# Patient Record
Sex: Female | Born: 1995 | Marital: Single | Smoking: Former smoker
Health system: Southern US, Community
[De-identification: ages and names within clinical notes are randomized; demographics above are authoritative.]

## PROBLEM LIST (undated history)

## (undated) DIAGNOSIS — F909 Attention-deficit hyperactivity disorder, unspecified type: Secondary | ICD-10-CM

## (undated) DIAGNOSIS — E039 Hypothyroidism, unspecified: Secondary | ICD-10-CM

## (undated) DIAGNOSIS — E041 Nontoxic single thyroid nodule: Secondary | ICD-10-CM

## (undated) DIAGNOSIS — F32A Depression, unspecified: Secondary | ICD-10-CM

## (undated) DIAGNOSIS — F329 Major depressive disorder, single episode, unspecified: Secondary | ICD-10-CM

## (undated) DIAGNOSIS — F419 Anxiety disorder, unspecified: Secondary | ICD-10-CM

## (undated) DIAGNOSIS — F3181 Bipolar II disorder: Secondary | ICD-10-CM

## (undated) HISTORY — DX: Nontoxic single thyroid nodule: E04.1

## (undated) HISTORY — DX: Anxiety disorder, unspecified: F41.9

## (undated) HISTORY — PX: HERNIA REPAIR: SHX51

## (undated) HISTORY — DX: Attention-deficit hyperactivity disorder, unspecified type: F90.9

## (undated) HISTORY — DX: Depression, unspecified: F32.A

## (undated) HISTORY — DX: Bipolar II disorder: F31.81

## (undated) HISTORY — PX: ROOT CANAL: SHX2363

## (undated) HISTORY — PX: ADENOIDECTOMY: SUR15

## (undated) HISTORY — DX: Major depressive disorder, single episode, unspecified: F32.9

## (undated) HISTORY — DX: Hypothyroidism, unspecified: E03.9

---

## 2016-02-22 ENCOUNTER — Encounter: Payer: Self-pay | Admitting: Obstetrics and Gynecology

## 2016-02-22 ENCOUNTER — Ambulatory Visit (INDEPENDENT_AMBULATORY_CARE_PROVIDER_SITE_OTHER): Payer: BLUE CROSS/BLUE SHIELD | Admitting: Obstetrics and Gynecology

## 2016-02-22 VITALS — BP 118/70 | HR 90 | Resp 16 | Ht 64.25 in | Wt 153.0 lb

## 2016-02-22 DIAGNOSIS — R350 Frequency of micturition: Secondary | ICD-10-CM

## 2016-02-22 DIAGNOSIS — R3915 Urgency of urination: Secondary | ICD-10-CM

## 2016-02-22 DIAGNOSIS — R300 Dysuria: Secondary | ICD-10-CM | POA: Diagnosis not present

## 2016-02-22 DIAGNOSIS — L293 Anogenital pruritus, unspecified: Secondary | ICD-10-CM

## 2016-02-22 DIAGNOSIS — Z113 Encounter for screening for infections with a predominantly sexual mode of transmission: Secondary | ICD-10-CM | POA: Diagnosis not present

## 2016-02-22 DIAGNOSIS — R102 Pelvic and perineal pain: Secondary | ICD-10-CM

## 2016-02-22 DIAGNOSIS — Z30431 Encounter for routine checking of intrauterine contraceptive device: Secondary | ICD-10-CM | POA: Diagnosis not present

## 2016-02-22 LAB — POCT URINALYSIS DIPSTICK
BILIRUBIN UA: NEGATIVE
Blood, UA: NEGATIVE
GLUCOSE UA: NEGATIVE
KETONES UA: NEGATIVE
NITRITE UA: NEGATIVE
Protein, UA: NEGATIVE
Urobilinogen, UA: NEGATIVE
pH, UA: 5

## 2016-02-22 LAB — HEPATITIS C ANTIBODY: HCV AB: NEGATIVE

## 2016-02-22 LAB — POCT URINE PREGNANCY: Preg Test, Ur: NEGATIVE

## 2016-02-22 MED ORDER — BETAMETHASONE VALERATE 0.1 % EX OINT: TOPICAL_OINTMENT | CUTANEOUS | Status: DC

## 2016-02-22 MED ORDER — SULFAMETHOXAZOLE-TRIMETHOPRIM 800-160 MG PO TABS: 1.0000 | ORAL_TABLET | Freq: Two times a day (BID) | ORAL | Status: DC

## 2016-02-22 NOTE — Progress Notes (Signed)
GYNECOLOGY  VISIT   HPI: 20 y.o.   Single  Caucasian  female   G0P0000 with Patient's last menstrual period was 02/10/2016.   here for   New Patient - pelvic pain. She had a mirena IUD placed in 5/16.  She c/o a 5 week history of sharp pain from her vagina up into her LLQ, goes up her spine and down her left leg. She then feels nausea and hot with the pain. Was happening very consistently for a few days right before her cycle. Not currently having pain. She can't feel her IUD strings, IUD strings not seen at an urgent care, no ultrasound was done. UPT was negative. She is sexually active, same partner x 3 months, using condoms.  She c/o frequency of urination, some urgency to void. Slight dysuria. She also c/o vulvar pruritus for the last 4 days. No abnormal d/c. No fevers. She does have back pain up her spine.   She is a full time Consulting civil engineerstudent at [employer], music and theater major.   GYNECOLOGIC HISTORY: Patient's last menstrual period was 02/10/2016. Contraception: IUD Menopausal hormone therapy: None        OB History    Gravida Para Term Preterm AB TAB SAB Ectopic Multiple Living   0 0 0 0 0 0 0 0 0 0          There are no active problems to display for this patient.   Past Medical History  Diagnosis Date  . Anxiety   . Depression   . ADHD (attention deficit hyperactivity disorder)   . Bipolar 2 disorder [facility](HCC)     Past Surgical History  Procedure Laterality Date  . Hernia repair      as child  . Adenoidectomy  ~2007  . Root canal      Current Outpatient Prescriptions  Medication Sig Dispense Refill  . ibuprofen (ADVIL,MOTRIN) 200 MG tablet Take 200 mg by mouth daily as needed.    Marland Kitchen. levonorgestrel (MIRENA) 20 MCG/24HR IUD 1 each by Intrauterine route once. Inserted 02/2015     No current facility-administered medications for this visit.     ALLERGIES: Menthol and Tape  History reviewed. No pertinent family history.  Social History   Social History  . Marital Status:  Single    Spouse Name: N/A  . Number of Children: N/A  . Years of Education: N/A   Occupational History  . Not on file.   Social History Main Topics  . Smoking status: Never Smoker   . Smokeless tobacco: Never Used  . Alcohol Use: 1.2 oz/week    2 Shots of liquor per week  . Drug Use: No  . Sexual Activity: Yes    Birth Control/ Protection: IUD, Condom     Comment: Inserted 02/2015   Other Topics Concern  . Not on file   Social History Narrative  . No narrative on file    Review of Systems  Constitutional: Negative.   HENT: Negative.   Eyes: Negative.   Respiratory: Negative.   Cardiovascular: Negative.   Gastrointestinal: Negative.   Genitourinary: Positive for flank pain.  Musculoskeletal: Negative.   Skin: Negative.   Neurological: Negative.   Endo/Heme/Allergies: Negative.   Psychiatric/Behavioral: Negative.     PHYSICAL EXAMINATION:    BP 118/70 mmHg  Pulse 90  Resp 16  Ht 5' 4.25" (1.632 m)  Wt 153 lb (69.4 kg)  BMI 26.06 kg/m2  LMP 02/10/2016    General appearance: alert, cooperative and appears stated age Neck:  no adenopathy, supple, symmetrical, trachea midline and thyroid normal to inspection and palpation Heart: regular rate and rhythm Lungs: CTAB Abdomen: soft, non-tender; bowel sounds normal; no masses,  no organomegaly CVA: bilateral +/- tenderness Lungs: CTAB Extremities: normal, atraumatic, no cyanosis Skin: normal color, texture and turgor, no rashes or lesions Lymph: normal cervical supraclavicular and inguinal nodes Neurologic: grossly normal   Pelvic: External genitalia:  no lesions              Urethra:  normal appearing urethra with no masses, tenderness or lesions              Bartholins and Skenes: normal                 Vagina: normal appearing vagina with normal color and discharge, no lesions              Cervix: no lesions and IUD strings not seen              Bimanual Exam:  Uterus:  normal size, contour, position,  consistency, mobility, non-tender              Adnexa: no mass, fullness, tenderness              Rectovaginal: Yes.  .  Confirms.              Anus:  normal sphincter tone, no lesions  Chaperone was present for exam.  Wet prep: no clue, no trich, + wbc KOH: no yeast PH: 4   ASSESSMENT Pelvic pain, worst in the LLQ, currently feeling okay. Normal pelvic exam, she might have had an ovarian cyst IUD strings not seen Genital pruritus, negative vaginal slides Urinary frequency, urgency and dysuria STD testing    PLAN Return for a GYN ultrasound Check urine for ua, c&s Will treat for presumed UTI Send affirm wet prep probe Will treat with steroid ointment for pruritus Continue to use condoms STD testing done   An After Visit Summary was printed and given to the patient.

## 2016-02-23 LAB — WET PREP BY MOLECULAR PROBE
Candida species: POSITIVE — AB
GARDNERELLA VAGINALIS: NEGATIVE
Trichomonas vaginosis: NEGATIVE

## 2016-02-23 LAB — GC/CHLAMYDIA PROBE AMP
CT Probe RNA: NOT DETECTED
GC Probe RNA: NOT DETECTED

## 2016-02-23 LAB — URINALYSIS, MICROSCOPIC ONLY
BACTERIA UA: NONE SEEN [HPF]
Casts: NONE SEEN [LPF]
Crystals: NONE SEEN [HPF]
Yeast: NONE SEEN [HPF]

## 2016-02-23 LAB — STD PANEL
HEP B S AG: NEGATIVE
HIV: NONREACTIVE

## 2016-02-24 LAB — URINE CULTURE: Colony Count: 95000

## 2016-02-26 ENCOUNTER — Ambulatory Visit (INDEPENDENT_AMBULATORY_CARE_PROVIDER_SITE_OTHER): Payer: BLUE CROSS/BLUE SHIELD

## 2016-02-26 ENCOUNTER — Encounter: Payer: Self-pay | Admitting: Obstetrics and Gynecology

## 2016-02-26 ENCOUNTER — Ambulatory Visit (INDEPENDENT_AMBULATORY_CARE_PROVIDER_SITE_OTHER): Payer: BLUE CROSS/BLUE SHIELD | Admitting: Obstetrics and Gynecology

## 2016-02-26 VITALS — BP 106/60 | HR 80 | Temp 98.7°F | Resp 16 | Ht 64.0 in | Wt 152.4 lb

## 2016-02-26 DIAGNOSIS — Z30431 Encounter for routine checking of intrauterine contraceptive device: Secondary | ICD-10-CM

## 2016-02-26 DIAGNOSIS — R102 Pelvic and perineal pain: Secondary | ICD-10-CM | POA: Diagnosis not present

## 2016-02-26 DIAGNOSIS — R1032 Left lower quadrant pain: Secondary | ICD-10-CM | POA: Diagnosis not present

## 2016-02-26 NOTE — Progress Notes (Signed)
      Images reviewed with the patient Terri ExonJill Jertson, MD

## 2016-02-26 NOTE — Progress Notes (Signed)
GYNECOLOGY  VISIT   HPI: 20 y.o.   Single  Caucasian  female   G0P0000 with Patient's last menstrual period was 02/10/2016.   here for  [place]. The patient has been having intermittent pelvic/LLQ abdominal pain for 6 weeks. Normal GYN ultrasound today. At the end of last week she had a negative GYN exam, except her Mirena IUD string was not seen (placed in 5/16). She was not hurting the day I saw her in the office, but reports that the pain has returned.  She reports daily BM, gets nausea and overheated with BM. Not currently having regular cycles. Recently treated for a UTI.   GYNECOLOGIC HISTORY: Patient's last menstrual period was 02/10/2016. Contraception:IUD Menopausal hormone therapy: none        OB History    Gravida Para Term Preterm AB TAB SAB Ectopic Multiple Living           There are no active problems to display for this patient.   Past Medical History  Diagnosis Date  . Anxiety   . Depression   . ADHD (attention deficit hyperactivity disorder)   . Bipolar 2 disorder [facility])     Past Surgical History  Procedure Laterality Date  . Hernia repair      as child  . Adenoidectomy  ~2007  . Root canal      Current Outpatient Prescriptions  Medication Sig Dispense Refill  . betamethasone valerate ointment (VALISONE) 0.1 % Apply a pea sized amount topically BID x 1-2 weeks 15 g 0  . ibuprofen (ADVIL,MOTRIN) 200 MG tablet Take 200 mg by mouth daily as needed.    Marland Kitchen levonorgestrel (MIRENA) 20 MCG/24HR IUD 1 each by Intrauterine route once. Inserted 02/2015    . sulfamethoxazole-trimethoprim (BACTRIM DS) 800-160 MG tablet Take 1 tablet by mouth 2 (two) times daily. One PO BID x 3 days (Patient not taking: Reported on 02/26/2016) 6 tablet 0   No current facility-administered medications for this visit.     ALLERGIES: Menthol and Tape  History reviewed. No pertinent family history.  Social History   Social History  . Marital Status: Single    Spouse  Name: N/A  . Number of Children: N/A  . Years of Education: N/A   Occupational History  . Not on file.   Social History Main Topics  . Smoking status: Never Smoker   . Smokeless tobacco: Never Used  . Alcohol Use: 1.2 oz/week    2 Shots of liquor per week  . Drug Use: No  . Sexual Activity: Yes    Birth Control/ Protection: IUD, Condom     Comment: Inserted 02/2015   Other Topics Concern  . Not on file   Social History Narrative    Review of Systems  Constitutional: Negative.   HENT: Positive for sore throat.        Swollen glands  Eyes: Negative.   Respiratory: Negative.   Cardiovascular: Negative.   Gastrointestinal: Positive for nausea and abdominal pain.       Bloating  Genitourinary: Positive for frequency.       Vulvar/vaginal itching Pain or burning with urination  Musculoskeletal: Positive for back pain and joint pain.  Skin: Negative.   Neurological: Positive for headaches.  Endo/Heme/Allergies: Negative.   Psychiatric/Behavioral: Negative.     PHYSICAL EXAMINATION:    BP 106/60 mmHg  Pulse 80  Temp(Src) 98.7 F (37.1 C)  Resp 16  Ht  (1.626  m)  Wt 152 lb 6.4 oz (69.128 kg)  BMI 26.15 kg/m2  LMP 02/10/2016    General appearance: alert, cooperative and appears stated age Patient declines exam today  ASSESSMENT 6 week h/o intermittent LLQ abdominal/pelvic pain, negative GYN exam last week, normal GYN ultrasound today. Images reviewed with the patient IUD check, strings not seen on exam, in proper location on ultrasound    PLAN I don't think her pain is of a GYN etiology Given her nausea and overheating with BM will send for a GI evaluation   An After Visit Summary was printed and given to the patient.  10+ minutes face to face time of which over 50% was spent in counseling.

## 2016-02-27 ENCOUNTER — Telehealth: Payer: Self-pay | Admitting: Obstetrics and Gynecology

## 2016-02-27 NOTE — Telephone Encounter (Signed)
Called and spoke with patient. Patient agreeable to appointment date and time.  Ok to close

## 2016-02-27 NOTE — Telephone Encounter (Signed)
Attempted call regarding referral appointment. Patients voicemail not set up per automated message. Unable to leave voicemail. The information is listed below. Should the patient need to cancel or reschedule this appointment, please advise them to call the office they've been referred to in order to reschedule.  *pts appt is tomorrow 02/28/16 @ 830 am with Dr Loreta AveMann.*  [address]  [address] [place]  [phone] phone

## 2016-04-21 DIAGNOSIS — F32A Depression, unspecified: Secondary | ICD-10-CM | POA: Insufficient documentation

## 2016-04-21 DIAGNOSIS — F329 Major depressive disorder, single episode, unspecified: Secondary | ICD-10-CM | POA: Insufficient documentation

## 2016-05-05 ENCOUNTER — Encounter: Payer: Self-pay | Admitting: Gastroenterology

## 2016-05-05 DIAGNOSIS — R1084 Generalized abdominal pain: Secondary | ICD-10-CM | POA: Insufficient documentation

## 2016-06-02 DIAGNOSIS — N946 Dysmenorrhea, unspecified: Secondary | ICD-10-CM | POA: Insufficient documentation

## 2016-07-03 ENCOUNTER — Ambulatory Visit: Payer: Self-pay | Admitting: Gastroenterology

## 2016-07-11 ENCOUNTER — Other Ambulatory Visit: Payer: Self-pay

## 2016-07-11 ENCOUNTER — Ambulatory Visit: Payer: Self-pay | Admitting: Gastroenterology

## 2016-10-22 ENCOUNTER — Ambulatory Visit (HOSPITAL_COMMUNITY)
Admission: EM | Admit: 2016-10-22 | Discharge: 2016-10-22 | Disposition: A | Discharge status: Home / Self Care | Payer: BLUE CROSS/BLUE SHIELD | Attending: Family Medicine | Admitting: Family Medicine

## 2016-10-22 ENCOUNTER — Encounter (HOSPITAL_COMMUNITY): Payer: Self-pay | Admitting: Emergency Medicine

## 2016-10-22 DIAGNOSIS — J40 Bronchitis, not specified as acute or chronic: Secondary | ICD-10-CM | POA: Diagnosis not present

## 2016-10-22 DIAGNOSIS — H1132 Conjunctival hemorrhage, left eye: Secondary | ICD-10-CM

## 2016-10-22 DIAGNOSIS — J00 Acute nasopharyngitis [common cold]: Secondary | ICD-10-CM

## 2016-10-22 MED ORDER — METHYLPREDNISOLONE SODIUM SUCC 125 MG IJ SOLR
INTRAMUSCULAR | Status: AC
  Filled 2016-10-22: qty 2

## 2016-10-22 MED ORDER — BENZONATATE 100 MG PO CAPS: 200.0000 mg | ORAL_CAPSULE | Freq: Three times a day (TID) | ORAL | 0 refills | Status: DC | PRN

## 2016-10-22 MED ORDER — ALBUTEROL SULFATE (2.5 MG/3ML) 0.083% IN NEBU
2.5000 mg | INHALATION_SOLUTION | Freq: Once | RESPIRATORY_TRACT | Status: AC
  Administered 2016-10-22: 2.5 mg via RESPIRATORY_TRACT

## 2016-10-22 MED ORDER — ALBUTEROL SULFATE (2.5 MG/3ML) 0.083% IN NEBU
INHALATION_SOLUTION | RESPIRATORY_TRACT | Status: AC
  Filled 2016-10-22: qty 3

## 2016-10-22 MED ORDER — METHYLPREDNISOLONE SODIUM SUCC 125 MG IJ SOLR
125.0000 mg | Freq: Once | INTRAMUSCULAR | Status: AC
  Administered 2016-10-22: 125 mg via INTRAMUSCULAR

## 2016-10-22 MED ORDER — IPRATROPIUM BROMIDE 0.06 % NA SOLN: 2.0000 | Freq: Four times a day (QID) | NASAL | 12 refills | Status: DC

## 2016-10-22 NOTE — ED Provider Notes (Signed)
CSN: [id number]     Arrival date & time 10/22/16  1535 History   First MD Initiated Contact with Patient 10/22/16 1717     Chief Complaint  Patient presents with  . Eye Pain   (Consider location/radiation/quality/duration/timing/severity/associated sxs/prior Treatment) Patient c/o coughing and chest congestion.  She has been coughing a lot.  She notices a broken blood vessel in her left eye.  She has nasal congestion.  She currently is on amoxicillin for URI.     The history is provided by the patient.  Eye Pain  This is a new problem. The current episode started more than 1 week ago. The problem occurs constantly. The problem has not changed since onset.Nothing aggravates the symptoms. Nothing relieves the symptoms. She has tried nothing for the symptoms.    Past Medical History:  Diagnosis Date  . ADHD (attention deficit hyperactivity disorder)   . Anxiety   . Bipolar 2 disorder (HCC)   . Depression    Past Surgical History:  Procedure Laterality Date  . ADENOIDECTOMY  ~2007  . HERNIA REPAIR     as child  . ROOT CANAL     No family history on file. Social History  Substance Use Topics  . Smoking status: Never Smoker  . Smokeless tobacco: Never Used  . Alcohol use No   OB History    Gravida Para Term Preterm AB Living   0 0 0 0 0 0   SAB TAB Ectopic Multiple Live Births   0 0 0 0       Review of Systems  Constitutional: Negative.   HENT: Positive for postnasal drip.   Eyes: Positive for pain.  Respiratory: Positive for cough.   Cardiovascular: Negative.   Gastrointestinal: Negative.   Endocrine: Negative.   Genitourinary: Negative.   Musculoskeletal: Negative.   Allergic/Immunologic: Negative.   Neurological: Negative.   Hematological: Negative.   Psychiatric/Behavioral: Negative.     Allergies  Menthol and Tape  Home Medications   Prior to Admission medications   Medication Sig Start Date End Date Taking? Authorizing Provider  benzonatate  (TESSALON) 100 MG capsule Take 2 capsules (200 mg total) by mouth 3 (three) times daily as needed for cough. 10/22/16   Deatra CanterWilliam J Tamberlyn Midgley, FNP  betamethasone valerate ointment (VALISONE) 0.1 % Apply a pea sized amount topically BID x 1-2 weeks 02/22/16   Romualdo BolkJill Evelyn Jertson, MD  ibuprofen (ADVIL,MOTRIN) 200 MG tablet Take 200 mg by mouth daily as needed.    Historical Provider, MD  ipratropium (ATROVENT) 0.06 % nasal spray Place 2 sprays into both nostrils 4 (four) times daily. 10/22/16   Deatra CanterWilliam J Lilliauna Van, FNP  levonorgestrel (MIRENA) 20 MCG/24HR IUD 1 each by Intrauterine route once. Inserted 02/2015    Historical Provider, MD  sulfamethoxazole-trimethoprim (BACTRIM DS) 800-160 MG tablet Take 1 tablet by mouth 2 (two) times daily. One PO BID x 3 days Patient not taking: Reported on 02/26/2016 02/22/16   Romualdo BolkJill Evelyn Jertson, MD   Meds Ordered and Administered this Visit   Medications  albuterol (PROVENTIL) (2.5 MG/3ML) 0.083% nebulizer solution 2.5 mg (2.5 mg Nebulization Given 10/22/16 1735)  methylPREDNISolone sodium succinate (SOLU-MEDROL) 125 mg/2 mL injection 125 mg (125 mg Intramuscular Given 10/22/16 1734)    BP 134/82   Pulse 82   Temp 98.6 F (37 C) (Oral)   Resp 16   Ht 5\' 4"  (1.626 m)   Wt 148 lb (67.1 kg)   SpO2 100%   BMI 25.40 kg/m  No  data found.   Physical Exam  Constitutional: She appears well-developed and well-nourished.  HENT:  Head: Normocephalic and atraumatic.  Right Ear: External ear normal.  Left Ear: External ear normal.  Mouth/Throat: Oropharynx is clear and moist.  Eyes: Conjunctivae and EOM are normal. Pupils are equal, round, and reactive to light.  Neck: Normal range of motion. Neck supple.  Cardiovascular: Normal rate, regular rhythm and normal heart sounds.   Pulmonary/Chest: Effort normal and breath sounds normal.  Abdominal: Soft. Bowel sounds are normal.  Nursing note and vitals reviewed.   Urgent Care Course   Clinical Course      Procedures (including critical care time)  Labs Review Labs Reviewed - No data to display  Imaging Review No results found.   Visual Acuity Review  Right Eye Distance:   Left Eye Distance:   Bilateral Distance:    Right Eye Near:   Left Eye Near:    Bilateral Near:         MDM   1. Subconjunctival hemorrhage of left eye   2. Bronchitis   3. Nasopharyngitis    Neb tx Solumedrol 125mg  IM Tessalon Perles Ipratropium Nasal Spray 0.06% 2 sprays per nostril qid prn   Push po fluids, rest, tylenol and motrin otc prn as directed for fever, arthralgias, and myalgias.  Follow up prn if sx's continue or persist.    Deatra CanterWilliam J Edit Ricciardelli, FNP 10/22/16 [phone]

## 2016-10-22 NOTE — ED Triage Notes (Signed)
PT reports she was sick with cold symptoms over a week ago. PT reports she has continued to cough. PT took amoxicillin and dayquil/nyquil without relief. PT was sent here from work because her eyes were"yellow" and there is a reddened area over left eye

## 2017-01-01 ENCOUNTER — Ambulatory Visit (HOSPITAL_COMMUNITY)
Admission: EM | Admit: 2017-01-01 | Discharge: 2017-01-01 | Disposition: A | Discharge status: Home / Self Care | Payer: BLUE CROSS/BLUE SHIELD | Attending: Internal Medicine | Admitting: Internal Medicine

## 2017-01-01 ENCOUNTER — Encounter (HOSPITAL_COMMUNITY): Payer: Self-pay | Admitting: Emergency Medicine

## 2017-01-01 DIAGNOSIS — F3181 Bipolar II disorder: Secondary | ICD-10-CM | POA: Insufficient documentation

## 2017-01-01 DIAGNOSIS — F909 Attention-deficit hyperactivity disorder, unspecified type: Secondary | ICD-10-CM | POA: Insufficient documentation

## 2017-01-01 DIAGNOSIS — R1310 Dysphagia, unspecified: Secondary | ICD-10-CM | POA: Diagnosis not present

## 2017-01-01 DIAGNOSIS — J039 Acute tonsillitis, unspecified: Secondary | ICD-10-CM

## 2017-01-01 DIAGNOSIS — F419 Anxiety disorder, unspecified: Secondary | ICD-10-CM | POA: Diagnosis not present

## 2017-01-01 DIAGNOSIS — J029 Acute pharyngitis, unspecified: Secondary | ICD-10-CM | POA: Diagnosis present

## 2017-01-01 LAB — POCT INFECTIOUS MONO SCREEN: MONO SCREEN: NEGATIVE

## 2017-01-01 LAB — POCT RAPID STREP A: Streptococcus, Group A Screen (Direct): NEGATIVE

## 2017-01-01 MED ORDER — METHYLPREDNISOLONE SODIUM SUCC 125 MG IJ SOLR
80.0000 mg | Freq: Once | INTRAMUSCULAR | Status: AC
  Administered 2017-01-01: 80 mg via INTRAMUSCULAR

## 2017-01-01 MED ORDER — PENICILLIN G BENZATHINE & PROC 900000-300000 UNIT/2ML IM SUSP: 2.4000 10*6.[IU] | Freq: Once | INTRAMUSCULAR | Status: DC

## 2017-01-01 MED ORDER — METHYLPREDNISOLONE SODIUM SUCC 125 MG IJ SOLR
INTRAMUSCULAR | Status: AC
  Filled 2017-01-01: qty 2

## 2017-01-01 MED ORDER — PENICILLIN G BENZATHINE 1200000 UNIT/2ML IM SUSP
INTRAMUSCULAR | Status: AC
  Filled 2017-01-01: qty 4

## 2017-01-01 MED ORDER — PENICILLIN G BENZATHINE 1200000 UNIT/2ML IM SUSP
1.2000 10*6.[IU] | Freq: Once | INTRAMUSCULAR | Status: AC
  Administered 2017-01-01: 1.2 10*6.[IU] via INTRAMUSCULAR

## 2017-01-01 NOTE — ED Provider Notes (Signed)
CSN: [id number]     Arrival date & time 01/01/17  1051 History   First MD Initiated Contact with Patient 01/01/17 1137     Chief Complaint  Patient presents with  . Lymphadenopathy   (Consider location/radiation/quality/duration/timing/severity/associated sxs/prior Treatment) HPI  Terri Richards is a 21 y.o. female presenting to UC with c/o gradually worsening sore throat that started yesterday with associated lymphadenopathy. Mild nasal congestion but no cough.  Denies n/v/d.  Denies fever or chills.  She has taken Advil with moderate relief.  Throat pain was 7/10 but now 3/10.    Past Medical History:  Diagnosis Date  . ADHD (attention deficit hyperactivity disorder)   . Anxiety   . Bipolar 2 disorder (HCC)   . Depression    Past Surgical History:  Procedure Laterality Date  . ADENOIDECTOMY  ~2007  . HERNIA REPAIR     as child  . ROOT CANAL     History reviewed. No pertinent family history. Social History  Substance Use Topics  . Smoking status: Never Smoker  . Smokeless tobacco: Never Used  . Alcohol use 1.2 oz/week    2 Shots of liquor per week   OB History    Gravida Para Term Preterm AB Living   0 0 0 0 0 0   SAB TAB Ectopic Multiple Live Births   0 0 0 0       Review of Systems  Constitutional: Negative for chills and fever.  HENT: Positive for congestion and sore throat. Negative for ear pain and voice change.   Respiratory: Negative for cough and shortness of breath.   Gastrointestinal: Negative for diarrhea, nausea and vomiting.  Musculoskeletal: Negative for arthralgias, myalgias, neck pain and neck stiffness.  Skin: Negative for rash.  Neurological: Negative for dizziness, light-headedness and headaches.    Allergies  Menthol and Tape  Home Medications   Prior to Admission medications   Medication Sig Start Date End Date Taking? Authorizing Provider  benzonatate (TESSALON) 100 MG capsule Take 2 capsules (200 mg total) by mouth 3 (three) times daily  as needed for cough. 10/22/16   Deatra CanterWilliam J Oxford, FNP  betamethasone valerate ointment (VALISONE) 0.1 % Apply a pea sized amount topically BID x 1-2 weeks 02/22/16   Romualdo BolkJill Evelyn Jertson, MD  ibuprofen (ADVIL,MOTRIN) 200 MG tablet Take 200 mg by mouth daily as needed.    Historical Provider, MD  ipratropium (ATROVENT) 0.06 % nasal spray Place 2 sprays into both nostrils 4 (four) times daily. 10/22/16   Deatra CanterWilliam J Oxford, FNP  levonorgestrel (MIRENA) 20 MCG/24HR IUD 1 each by Intrauterine route once. Inserted 02/2015    Historical Provider, MD  medroxyPROGESTERone (DEPO-PROVERA) 150 MG/ML injection Inject 150 mg into the muscle every 3 (three) months.    Historical Provider, MD  sulfamethoxazole-trimethoprim (BACTRIM DS) 800-160 MG tablet Take 1 tablet by mouth 2 (two) times daily. One PO BID x 3 days Patient not taking: Reported on 02/26/2016 02/22/16   Romualdo BolkJill Evelyn Jertson, MD   Meds Ordered and Administered this Visit   Medications  methylPREDNISolone sodium succinate (SOLU-MEDROL) 125 mg/2 mL injection 80 mg (80 mg Intramuscular Given 01/01/17 1227)  penicillin g benzathine (BICILLIN LA) 1200000 UNIT/2ML injection 1.2 Million Units (1.2 Million Units Intramuscular Given 01/01/17 1226)    BP 125/68 (BP Location: Right Arm)   Pulse 91   Temp 98.6 F (37 C) (Oral)   Resp 18   SpO2 100%  No data found.   Physical Exam  Constitutional: She is  oriented to person, place, and time. She appears well-developed and well-nourished. No distress.  HENT:  Head: Normocephalic and atraumatic.  Right Ear: Tympanic membrane normal.  Left Ear: Tympanic membrane normal.  Nose: Nose normal.  Mouth/Throat: Uvula is midline and mucous membranes are normal. Oropharyngeal exudate, posterior oropharyngeal edema and posterior oropharyngeal erythema present. No tonsillar abscesses.  Eyes: EOM are normal.  Neck: Normal range of motion. Neck supple.  Cardiovascular: Normal rate and regular rhythm.   Pulmonary/Chest:  Effort normal and breath sounds normal. No stridor. No respiratory distress. She has no wheezes. She has no rales.  Musculoskeletal: Normal range of motion.  Lymphadenopathy:    She has cervical adenopathy.  Neurological: She is alert and oriented to person, place, and time.  Skin: Skin is warm and dry. She is not diaphoretic.  Psychiatric: She has a normal mood and affect. Her behavior is normal.  Nursing note and vitals reviewed.   Urgent Care Course     Procedures (including critical care time)  Labs Review Labs Reviewed  POCT RAPID STREP A  POCT INFECTIOUS MONO SCREEN    Imaging Review No results found.   MDM   1. Exudative tonsillitis    Pt c/o sore throat since yesterday. Hx and exam c/w strep pharyngitis.  Rapid strep: negative Will send culture.  Pt requesting treatment with a shot instead of pills.  Mono spot: Negative  Will treat for strep while culture pending.     Junius Finner, PA-C 01/01/17 1257

## 2017-01-01 NOTE — ED Triage Notes (Signed)
Pt here for bilateral submandibular/lymphnodes onset yest... Also reports she felt warm but did not take her temp  Sx also include: congestion, ST, dysphagia  Taking advil w/temp relief.   A&O x4... NAD

## 2017-01-01 NOTE — Discharge Instructions (Signed)

## 2017-01-04 LAB — CULTURE, GROUP A STREP (THRC)

## 2017-04-24 ENCOUNTER — Ambulatory Visit (HOSPITAL_COMMUNITY)
Admission: EM | Admit: 2017-04-24 | Discharge: 2017-04-24 | Disposition: A | Discharge status: Home / Self Care | Payer: BLUE CROSS/BLUE SHIELD | Attending: Internal Medicine | Admitting: Internal Medicine

## 2017-04-24 ENCOUNTER — Encounter (HOSPITAL_COMMUNITY): Payer: Self-pay | Admitting: Emergency Medicine

## 2017-04-24 DIAGNOSIS — F319 Bipolar disorder, unspecified: Secondary | ICD-10-CM | POA: Diagnosis not present

## 2017-04-24 DIAGNOSIS — Z760 Encounter for issue of repeat prescription: Secondary | ICD-10-CM

## 2017-04-24 MED ORDER — ARIPIPRAZOLE 5 MG PO TABS: 5.0000 mg | ORAL_TABLET | Freq: Every day | ORAL | 0 refills | Status: DC

## 2017-04-24 NOTE — ED Provider Notes (Signed)
CSN: [id number]     Arrival date & time 04/24/17  1830 History   First MD Initiated Contact with Patient 04/24/17 1857     Chief Complaint  Patient presents with  . Medication Refill   (Consider location/radiation/quality/duration/timing/severity/associated sxs/prior Treatment) Terri Richards is a 21 y.o. female with a past history of ADHD, Bipolar, anxiety, and depression, who presents to [facility] with a chief complaint of needing a refill on her abilify. She stated her PCP is on a two week vacation and her last tablet was taken last night. States she has been stable on this medicine with no reactions.    The history is provided by the patient.  Medication Refill  Medications/supplies requested:  Abilify 5 mg Reason for request:  Clinic/provider not available Medications taken before: yes - see home medications   Patient has complete original prescription information: yes     Past Medical History:  Diagnosis Date  . ADHD (attention deficit hyperactivity disorder)   . Anxiety   . Bipolar 2 disorder (HCC)   . Depression    Past Surgical History:  Procedure Laterality Date  . ADENOIDECTOMY  ~2007  . HERNIA REPAIR     as child  . ROOT CANAL     No family history on file. Social History  Substance Use Topics  . Smoking status: Never Smoker  . Smokeless tobacco: Never Used  . Alcohol use 1.2 oz/week    2 Shots of liquor per week   OB History    Gravida Para Term Preterm AB Living   0 0 0 0 0 0   SAB TAB Ectopic Multiple Live Births   0 0 0 0       Review of Systems  Constitutional: Negative.   HENT: Negative.   Respiratory: Negative.   Cardiovascular: Negative.   Gastrointestinal: Negative.   Musculoskeletal: Negative.   Skin: Negative.   Neurological: Negative.     Allergies  Menthol and Tape  Home Medications   Prior to Admission medications   Medication Sig Start Date End Date Taking? Authorizing Provider  ARIPiprazole (ABILIFY) 5 MG  tablet Take 1 tablet (5 mg total) by mouth daily. 04/24/17   Dorena Bodo, NP  benzonatate (TESSALON) 100 MG capsule Take 2 capsules (200 mg total) by mouth 3 (three) times daily as needed for cough. 10/22/16   Deatra Canter, FNP  betamethasone valerate ointment (VALISONE) 0.1 % Apply a pea sized amount topically BID x 1-2 weeks 02/22/16   Romualdo Bolk, MD  ibuprofen (ADVIL,MOTRIN) 200 MG tablet Take 200 mg by mouth daily as needed.    [provider]  ipratropium (ATROVENT) 0.06 % nasal spray Place 2 sprays into both nostrils 4 (four) times daily. 10/22/16   Deatra Canter, FNP  levonorgestrel (MIRENA) 20 MCG/24HR IUD 1 each by Intrauterine route once. Inserted 02/2015    [provider]  medroxyPROGESTERone (DEPO-PROVERA) 150 MG/ML injection Inject 150 mg into the muscle every 3 (three) months.    [provider]  sulfamethoxazole-trimethoprim (BACTRIM DS) 800-160 MG tablet Take 1 tablet by mouth 2 (two) times daily. One PO BID x 3 days Patient not taking: Reported on 02/26/2016 02/22/16   Romualdo Bolk, MD   Meds Ordered and Administered this Visit  Medications - No data to display  BP 121/73   Pulse 86   Temp 97.7 F (36.5 C) (Oral)   Resp 16   Ht 5\' 4"  (1.626 m)  Wt 145 lb (65.8 kg)   LMP 04/06/2017   SpO2 98%   BMI 24.89 kg/m  No data found.   Physical Exam  Constitutional: She is oriented to person, place, and time. She appears well-developed and well-nourished. No distress.  HENT:  Head: Normocephalic and atraumatic.  Right Ear: External ear normal.  Left Ear: External ear normal.  Eyes: Conjunctivae are normal.  Neck: Normal range of motion.  Neurological: She is alert and oriented to person, place, and time.  Skin: Skin is warm and dry. Capillary refill takes less than 2 seconds. No rash noted. She is not diaphoretic. No erythema.  Psychiatric: She has a normal mood and affect. Her behavior is normal.  Nursing note and  vitals reviewed.   Urgent Care Course     Procedures (including critical care time)  Labs Review Labs Reviewed - No data to display  Imaging Review No results found.     MDM   1. Medication refill     Terri Richards is a 21 y.o. female with a past history of ADHD, Bipolar, anxiety, and depression, who [facility] with a chief complaint of needing a refill on her abilify. She stated her PCP is on a two week vacation and her last tablet was taken last night. States she has been stable on this medicine with no reactions.  Medicine was refilled. Recommend following up with her primary care provider when she returns from vacation, for further evaluation and management of her condition.     Dorena BodoKennard, Owais Pruett, NP 04/24/17 1910

## 2017-04-24 NOTE — Discharge Instructions (Signed)
I have refilled your medication. Given a one-month supply. I recommend scheduling an appointment with your primary care provider for further evaluation and management of this condition as needed.

## 2017-04-24 NOTE — ED Triage Notes (Signed)
PT reports she needs a refill on her 5mg  abilify script. Prescriber has been out of town for 2 weeks. She took one last night, ran out today.

## 2017-11-14 ENCOUNTER — Ambulatory Visit (HOSPITAL_COMMUNITY)
Admission: EM | Admit: 2017-11-14 | Discharge: 2017-11-14 | Disposition: A | Discharge status: Home / Self Care | Payer: BLUE CROSS/BLUE SHIELD | Attending: Physician Assistant | Admitting: Physician Assistant

## 2017-11-14 ENCOUNTER — Encounter (HOSPITAL_COMMUNITY): Payer: Self-pay | Admitting: Emergency Medicine

## 2017-11-14 DIAGNOSIS — J029 Acute pharyngitis, unspecified: Secondary | ICD-10-CM | POA: Insufficient documentation

## 2017-11-14 DIAGNOSIS — M791 Myalgia, unspecified site: Secondary | ICD-10-CM | POA: Diagnosis not present

## 2017-11-14 DIAGNOSIS — R509 Fever, unspecified: Secondary | ICD-10-CM | POA: Diagnosis not present

## 2017-11-14 LAB — POCT RAPID STREP A: Streptococcus, Group A Screen (Direct): NEGATIVE

## 2017-11-14 LAB — POCT INFECTIOUS MONO SCREEN: MONO SCREEN: NEGATIVE

## 2017-11-14 MED ORDER — KETOROLAC TROMETHAMINE 60 MG/2ML IM SOLN
60.0000 mg | Freq: Once | INTRAMUSCULAR | Status: AC
  Administered 2017-11-14: 60 mg via INTRAMUSCULAR

## 2017-11-14 MED ORDER — GI COCKTAIL ~~LOC~~
30.0000 mL | Freq: Once | ORAL | Status: AC
Start: 2017-11-14 — End: 2017-11-14
  Administered 2017-11-14: 30 mL via ORAL

## 2017-11-14 MED ORDER — NAPROXEN 500 MG PO TABS: 500.0000 mg | ORAL_TABLET | Freq: Two times a day (BID) | ORAL | 0 refills | Status: AC

## 2017-11-14 MED ORDER — LIDOCAINE VISCOUS 2 % MT SOLN
5.0000 mL | OROMUCOSAL | 0 refills | Status: DC | PRN
Start: 2017-11-14 — End: 2018-03-10

## 2017-11-14 MED ORDER — GI COCKTAIL ~~LOC~~
ORAL | Status: AC
  Filled 2017-11-14: qty 30

## 2017-11-14 MED ORDER — KETOROLAC TROMETHAMINE 60 MG/2ML IM SOLN
INTRAMUSCULAR | Status: AC
  Filled 2017-11-14: qty 2

## 2017-11-14 MED ORDER — AMOXICILLIN 500 MG PO CAPS: 1000.0000 mg | ORAL_CAPSULE | Freq: Two times a day (BID) | ORAL | 0 refills | Status: DC

## 2017-11-14 NOTE — ED Provider Notes (Addendum)
11/14/2017 2:41 PM   DOB: [birth year] / MRN: [id number]  SUBJECTIVE:  Terri Richards is a 22 y.o. female presenting for sore throat, body aches and fatigue and fever.  The symptoms started 2 days ago.  Was tested already for strep and flu [facility] these were negative.  Today she complains mostly of sore throat and this is exquisite.  She has a mild fever that she has been taking multiple over-the-counter medications for and this is helping some.  She is allergic to menthol and tape.   She  has a past medical history of ADHD (attention deficit hyperactivity disorder), Anxiety, Bipolar 2 disorder (HCC), and Depression.    She  reports that  has never smoked. she has never used smokeless tobacco. She reports that she drinks about 1.2 oz of alcohol per week. She reports that she uses drugs. Drug: Marijuana. She  reports that she does not engage in sexual activity. The patient  has a past surgical history that includes Hernia repair; Adenoidectomy (~2007); and Root canal.  Her family history is not on file.  Review of Systems  Cardiovascular: Negative for chest pain and leg swelling.  Genitourinary: Negative for dysuria and urgency.  Musculoskeletal: Negative for myalgias.       OBJECTIVE:  BP 125/69 (BP Location: Left Arm)   Pulse 98   Temp 99.2 F (37.3 C) (Oral)   Resp (!) 22 Comment: notified RN  LMP 10/27/2017   SpO2 100%   Physical Exam  Constitutional: She is active.  Non-toxic appearance.  HENT:  Mouth/Throat: Uvula is midline. No tonsillar abscesses.    Cardiovascular: Normal rate, regular rhythm, S1 normal, S2 normal, normal heart sounds and intact distal pulses. Exam reveals no gallop, no friction rub and no decreased pulses.  No murmur heard. Pulmonary/Chest: Effort normal. No stridor. No tachypnea. No respiratory distress. She has no wheezes. She has no rales.  Abdominal: She exhibits no distension. There is no splenomegaly. There is no tenderness.    Musculoskeletal: She exhibits no edema.  Lymphadenopathy:       Head (right side): Tonsillar adenopathy present. No posterior auricular and no occipital adenopathy present.       Head (left side): Tonsillar adenopathy present. No posterior auricular and no occipital adenopathy present.  Neurological: She is alert.  Skin: Skin is warm and dry. She is not diaphoretic. No pallor.    Results for orders placed or performed during the hospital encounter of 11/14/17 (from the past 72 hour(s))  POCT rapid strep A [facility])     Status: None   Collection Time: 11/14/17  2:27 PM  Result Value Ref Range   Streptococcus, Group A Screen (Direct) NEGATIVE NEGATIVE  Infectious mono screen, POC     Status: None   Collection Time: 11/14/17  2:30 PM  Result Value Ref Range   Mono Screen NEGATIVE NEGATIVE    No results found.  ASSESSMENT AND PLAN:  Orders Placed This Encounter  Procedures  . Culture, group A strep    Standing Status:   Standing    Number of Occurrences:   1  . POCT rapid strep A [facility])    Standing Status:   Standing    Number of Occurrences:   1  . Infectious mono screen, POC    Standing Status:   Standing    Number of Occurrences:   1     Sore throat patient with a negative strep rapid and negative mono  in the office.  She has exudate in the back of her throat and appears to be in exquisite pain today.  This is most likely some form of strep infection.  Fortunately uvula is midline we will go ahead and start amoxicillin 1000 mg twice daily, naproxen, and viscous lidocaine.  She will come back in 48 hours if she is not improving.      The patient is advised to call or return to clinic if she does not see an improvement in symptoms, or to seek the care of the closest emergency department if she worsens with the above plan.   Deliah Boston, MHS, PA-C 11/14/2017 2:41 PM    Ofilia Neas, PA-C 11/14/17 1445    Ofilia Neas, PA-C 11/14/17  1447

## 2017-11-14 NOTE — ED Triage Notes (Signed)
Pt c/o sore throat, body aches, fatigue and fever onset Thursday. Patient reports that she went to the student [facility] and was tested for strep and influenza and the results were negative.

## 2017-11-14 NOTE — Discharge Instructions (Signed)
This is most likely some form of strep throat, possibly not group A.  Given the appearance of your throat I am going to treat you for group A strep regardless of today's result.  You were negative for mono.  You may also have strep throat without having group A strep.  At any rate you should improve in the next 12-24 hours after starting an antibiotic.  Please follow-up with your primary care provider or come back here in the next 48 hours if there is any question of improvement by that time.

## 2017-11-14 NOTE — ED Notes (Signed)
Per provider for pt to Gargle her Gi Cocktail and for pt to spit it out. Pt is aware and verbalized/showed understanding.

## 2017-11-17 LAB — CULTURE, GROUP A STREP (THRC)

## 2017-12-21 ENCOUNTER — Telehealth: Payer: Self-pay | Admitting: Obstetrics and Gynecology

## 2017-12-21 NOTE — Telephone Encounter (Signed)
Spoke with patient, requesting OV to discuss Paragard IUD.   Patient states she has had Mirena IUD in the past. Removed d/t "being rejected by body". Patient states she is in a new relationship, request contraceptive and to regulate cycles. LMP 12/07/17. States menses varies in length.   Denies any other GYN concerns or pain.   Last OV 02/26/16 with Dr. Oscar LaJertson, patient request to schedule with different provider. Advised patient PAP smear recommended at age 22, recommended AEX, can then discuss options for IUD. AEX scheduled for 01/04/18 with Dr. Edward JollySilva at 11am. Advised patient will review with Dr. Edward JollySilva and return call with any additional recommendations, patient is agreeable.   Dr. Edward JollySilva -any additional recommendations?

## 2017-12-21 NOTE — Telephone Encounter (Signed)
Patient would like to discuss getting an iud. Last visit 02/26/16

## 2017-12-21 NOTE — Telephone Encounter (Signed)
I agree with office visit.  Ok to close encounter.

## 2017-12-24 ENCOUNTER — Ambulatory Visit: Payer: Self-pay | Admitting: Allergy & Immunology

## 2018-01-04 ENCOUNTER — Ambulatory Visit: Payer: Self-pay | Admitting: Obstetrics and Gynecology

## 2018-01-08 ENCOUNTER — Ambulatory Visit: Payer: Self-pay | Admitting: Obstetrics and Gynecology

## 2018-01-08 ENCOUNTER — Encounter: Payer: Self-pay | Admitting: Obstetrics and Gynecology

## 2018-03-10 ENCOUNTER — Ambulatory Visit (INDEPENDENT_AMBULATORY_CARE_PROVIDER_SITE_OTHER): Payer: BLUE CROSS/BLUE SHIELD | Admitting: Certified Nurse Midwife

## 2018-03-10 ENCOUNTER — Encounter: Payer: Self-pay | Admitting: Certified Nurse Midwife

## 2018-03-10 ENCOUNTER — Other Ambulatory Visit (HOSPITAL_COMMUNITY)
Admission: RE | Admit: 2018-03-10 | Discharge: 2018-03-10 | Disposition: A | Discharge status: Home / Self Care | Payer: BLUE CROSS/BLUE SHIELD | Source: Ambulatory Visit | Attending: Obstetrics and Gynecology | Admitting: Obstetrics and Gynecology

## 2018-03-10 VITALS — BP 112/70 | HR 76 | Resp 18 | Ht 64.0 in | Wt 163.4 lb

## 2018-03-10 DIAGNOSIS — Z9189 Other specified personal risk factors, not elsewhere classified: Secondary | ICD-10-CM | POA: Diagnosis not present

## 2018-03-10 DIAGNOSIS — Z3009 Encounter for other general counseling and advice on contraception: Secondary | ICD-10-CM

## 2018-03-10 DIAGNOSIS — Z01419 Encounter for gynecological examination (general) (routine) without abnormal findings: Secondary | ICD-10-CM | POA: Diagnosis not present

## 2018-03-10 DIAGNOSIS — Z113 Encounter for screening for infections with a predominantly sexual mode of transmission: Secondary | ICD-10-CM | POA: Diagnosis not present

## 2018-03-10 DIAGNOSIS — Z124 Encounter for screening for malignant neoplasm of cervix: Secondary | ICD-10-CM

## 2018-03-10 DIAGNOSIS — Z789 Other specified health status: Secondary | ICD-10-CM

## 2018-03-10 LAB — POCT URINE PREGNANCY: Preg Test, Ur: NEGATIVE

## 2018-03-10 NOTE — Patient Instructions (Signed)
General topics  Next pap or exam is  due in 1 year Take a Women's multivitamin Take 1200 mg. of calcium daily - prefer dietary If any concerns in interim to call back  Breast Self-Awareness Practicing breast self-awareness may pick up problems early, prevent significant medical complications, and possibly save your life. By practicing breast self-awareness, you can become familiar with how your breasts look and feel and if your breasts are changing. This allows you to notice changes early. It can also offer you some reassurance that your breast health is good. One way to learn what is normal for your breasts and whether your breasts are changing is to do a breast self-exam. If you find a lump or something that was not present in the past, it is best to contact your caregiver right away. Other findings that should be evaluated by your caregiver include nipple discharge, especially if it is bloody; skin changes or reddening; areas where the skin seems to be pulled in (retracted); or new lumps and bumps. Breast pain is seldom associated with cancer (malignancy), but should also be evaluated by a caregiver. BREAST SELF-EXAM The best time to examine your breasts is 5 7 days after your menstrual period is over.  ExitCare Patient Information 2013 [employer].   Exercise to Stay Healthy Exercise helps you become and stay healthy. EXERCISE IDEAS AND TIPS Choose exercises that:  You enjoy.  Fit into your day. You do not need to exercise really hard to be healthy. You can do exercises at a slow or medium level and stay healthy. You can:  Stretch before and after working out.  Try yoga, Pilates, or tai chi.  Lift weights.  Walk fast, swim, jog, run, climb stairs, bicycle, dance, or rollerskate.  Take aerobic classes. Exercises that burn about 150 calories:  Running 1  miles in 15 minutes.  Playing volleyball for 45 to 60 minutes.  Washing and waxing a car for 45 to 60  minutes.  Playing touch football for 45 minutes.  Walking 1  miles in 35 minutes.  Pushing a stroller 1  miles in 30 minutes.  Playing basketball for 30 minutes.  Raking leaves for 30 minutes.  Bicycling 5 miles in 30 minutes.  Walking 2 miles in 30 minutes.  Dancing for 30 minutes.  Shoveling snow for 15 minutes.  Swimming laps for 20 minutes.  Walking up stairs for 15 minutes.  Bicycling 4 miles in 15 minutes.  Gardening for 30 to 45 minutes.  Jumping rope for 15 minutes.  Washing windows or floors for 45 to 60 minutes. Document Released: 11/15/2010 Document Revised: 01/05/2012 Document Reviewed: 11/15/2010 ExitCare Patient Information 2013 [employer].   Other topics ( that may be useful information):    Sexually Transmitted Disease Sexually transmitted disease (STD) refers to any infection that is passed from person to person during sexual activity. This may happen by way of saliva, semen, blood, vaginal mucus, or urine. Common STDs include:  Gonorrhea.  Chlamydia.  Syphilis.  HIV/AIDS.  Genital herpes.  Hepatitis B and C.  Trichomonas.  Human papillomavirus (HPV).  Pubic lice. CAUSES  An STD may be spread by bacteria, virus, or parasite. A person can get an STD by:  Sexual intercourse with an infected person.  Sharing sex toys with an infected person.  Sharing needles with an infected person.  Having intimate contact with the genitals, mouth, or rectal areas of an infected person. SYMPTOMS  Some people may not have any symptoms, but   they can still pass the infection to others. Different STDs have different symptoms. Symptoms include:  Painful or bloody urination.  Pain in the pelvis, abdomen, vagina, anus, throat, or eyes.  Skin rash, itching, irritation, growths, or sores (lesions). These usually occur in the genital or anal area.  Abnormal vaginal discharge.  Penile discharge in men.  Soft, flesh-colored skin growths in the  genital or anal area.  Fever.  Pain or bleeding during sexual intercourse.  Swollen glands in the groin area.  Yellow skin and eyes (jaundice). This is seen with hepatitis. DIAGNOSIS  To make a diagnosis, your caregiver may:  Take a medical history.  Perform a physical exam.  Take a specimen (culture) to be examined.  Examine a sample of discharge under a microscope.  Perform blood test TREATMENT   Chlamydia, gonorrhea, trichomonas, and syphilis can be cured with antibiotic medicine.  Genital herpes, hepatitis, and HIV can be treated, but not cured, with prescribed medicines. The medicines will lessen the symptoms.  Genital warts from HPV can be treated with medicine or by freezing, burning (electrocautery), or surgery. Warts may come back.  HPV is a virus and cannot be cured with medicine or surgery.However, abnormal areas may be followed very closely by your caregiver and may be removed from the cervix, vagina, or vulva through office procedures or surgery. If your diagnosis is confirmed, your recent sexual partners need treatment. This is true even if they are symptom-free or have a negative culture or evaluation. They should not have sex until their caregiver says it is okay. HOME CARE INSTRUCTIONS  All sexual partners should be informed, tested, and treated for all STDs.  Take your antibiotics as directed. Finish them even if you start to feel better.  Only take over-the-counter or prescription medicines for pain, discomfort, or fever as directed by your caregiver.  Rest.  Eat a balanced diet and drink enough fluids to keep your urine clear or pale yellow.  Do not have sex until treatment is completed and you have followed up with your caregiver. STDs should be checked after treatment.  Keep all follow-up appointments, Pap tests, and blood tests as directed by your caregiver.  Only use latex condoms and water-soluble lubricants during sexual activity. Do not use  petroleum jelly or oils.  Avoid alcohol and illegal drugs.  Get vaccinated for HPV and hepatitis. If you have not received these vaccines in the past, talk to your caregiver about whether one or both might be right for you.  Avoid risky sex practices that can break the skin. The only way to avoid getting an STD is to avoid all sexual activity.Latex condoms and dental dams (for oral sex) will help lessen the risk of getting an STD, but will not completely eliminate the risk. SEEK MEDICAL CARE IF:   You have a fever.  You have any new or worsening symptoms. Document Released: 01/03/2003 Document Revised: 01/05/2012 Document Reviewed: 01/10/2011 [facility] Patient Information 2013 [employer].    Domestic Abuse You are being battered or abused if someone close to you hits, pushes, or physically hurts you in any way. You also are being abused if you are forced into activities. You are being sexually abused if you are forced to have sexual contact of any kind. You are being emotionally abused if you are made to feel worthless or if you are constantly threatened. It is important to remember that help is available. No one has the right to abuse you. PREVENTION OF FURTHER  ABUSE  Learn the warning signs of danger. This varies with situations but may include: the use of alcohol, threats, isolation from friends and family, or forced sexual contact. Leave if you feel that violence is going to occur.  If you are attacked or beaten, report it to the police so the abuse is documented. You do not have to press charges. The police can protect you while you or the attackers are leaving. Get the officer's name and badge number and a copy of the report.  Find someone you can trust and tell them what is happening to you: your caregiver, a nurse, clergy member, close friend or family member. Feeling ashamed is natural, but remember that you have done nothing wrong. No one deserves abuse. Document Released:  10/10/2000 Document Revised: 01/05/2012 Document Reviewed: 12/19/2010 ExitCare Patient Information 2013 [employer].    How Much is Too Much Alcohol? Drinking too much alcohol can cause injury, accidents, and health problems. These types of problems can include:   Car crashes.  Falls.  Family fighting (domestic violence).  Drowning.  Fights.  Injuries.  Burns.  Damage to certain organs.  Having a baby with birth defects. ONE DRINK CAN BE TOO MUCH WHEN YOU ARE:  Working.  Pregnant or breastfeeding.  Taking medicines. Ask your doctor.  Driving or planning to drive. If you or someone you know has a drinking problem, get help from a doctor.  Document Released: 08/09/2009 Document Revised: 01/05/2012 Document Reviewed: 08/09/2009 ExitCare Patient Information 2013 [employer].   Smoking Hazards Smoking cigarettes is extremely bad for your health. Tobacco smoke has over 200 known poisons in it. There are over 60 chemicals in tobacco smoke that cause cancer. Some of the chemicals found in cigarette smoke include:   Cyanide.  Benzene.  Formaldehyde.  Methanol (wood alcohol).  Acetylene (fuel used in welding torches).  Ammonia. Cigarette smoke also contains the poisonous gases nitrogen oxide and carbon monoxide.  Cigarette smokers have an increased risk of many serious medical problems and Smoking causes approximately:  90% of all lung cancer deaths in men.  80% of all lung cancer deaths in women.  90% of deaths from chronic obstructive lung disease. Compared with nonsmokers, smoking increases the risk of:  Coronary heart disease by 2 to 4 times.  Stroke by 2 to 4 times.  Men developing lung cancer by 23 times.  Women developing lung cancer by 13 times.  Dying from chronic obstructive lung diseases by 12 times.  . Smoking is the most preventable cause of death and disease in our society.  WHY IS SMOKING ADDICTIVE?  Nicotine is the chemical  agent in tobacco that is capable of causing addiction or dependence.  When you smoke and inhale, nicotine is absorbed rapidly into the bloodstream through your lungs. Nicotine absorbed through the lungs is capable of creating a powerful addiction. Both inhaled and non-inhaled nicotine may be addictive.  Addiction studies of cigarettes and spit tobacco show that addiction to nicotine occurs mainly during the teen years, when young people begin using tobacco products. WHAT ARE THE BENEFITS OF QUITTING?  There are many health benefits to quitting smoking.   Likelihood of developing cancer and heart disease decreases. Health improvements are seen almost immediately.  Blood pressure, pulse rate, and breathing patterns start returning to normal soon after quitting. QUITTING SMOKING   American Lung Association - 1-800-LUNGUSA  American Cancer Society - 1-800-ACS-2345 Document Released: 11/20/2004 Document Revised: 01/05/2012 Document Reviewed: 07/25/2009 ExitCare Patient Information 2013 Exit[employer]   LL[employer].   Stress Management Stress is a state of physical or mental tension that often results from changes in your life or normal routine. Some common causes of stress are:  Death of a loved one.  Injuries or severe illnesses.  Getting fired or changing jobs.  Moving into a new home. Other causes may be:  Sexual problems.  Business or financial losses.  Taking on a large debt.  Regular conflict with someone at home or at work.  Constant tiredness from lack of sleep. It is not just bad things that are stressful. It may be stressful to:  Win the lottery.  Get married.  Buy a new car. The amount of stress that can be easily tolerated varies from person to person. Changes generally cause stress, regardless of the types of change. Too much stress can affect your health. It may lead to physical or emotional problems. Too little stress (boredom) may also become stressful. SUGGESTIONS TO  REDUCE STRESS:  Talk things over with your family and friends. It often is helpful to share your concerns and worries. If you feel your problem is serious, you may want to get help from a professional counselor.  Consider your problems one at a time instead of lumping them all together. Trying to take care of everything at once may seem impossible. List all the things you need to do and then start with the most important one. Set a goal to accomplish 2 or 3 things each day. If you expect to do too many in a single day you will naturally fail, causing you to feel even more stressed.  Do not use alcohol or drugs to relieve stress. Although you may feel better for a short time, they do not remove the problems that caused the stress. They can also be habit forming.  Exercise regularly - at least 3 times per week. Physical exercise can help to relieve that "uptight" feeling and will relax you.  The shortest distance between despair and hope is often a good night's sleep.  Go to bed and get up on time allowing yourself time for appointments without being rushed.  Take a short "time-out" period from any stressful situation that occurs during the day. Close your eyes and take some deep breaths. Starting with the muscles in your face, tense them, hold it for a few seconds, then relax. Repeat this with the muscles in your neck, shoulders, hand, stomach, back and legs.  Take good care of yourself. Eat a balanced diet and get plenty of rest.  Schedule time for having fun. Take a break from your daily routine to relax. HOME CARE INSTRUCTIONS   Call if you feel overwhelmed by your problems and feel you can no longer manage them on your own.  Return immediately if you feel like hurting yourself or someone else. Document Released: 04/08/2001 Document Revised: 01/05/2012 Document Reviewed: 11/29/2007 ExitCare Patient Information 2013 [employer].   

## 2018-03-10 NOTE — Progress Notes (Signed)
22 y.o. G0P0000 Single  Caucasian Fe here for annual exam.  Periods normal, no concerns. Contraception is condoms most of the time. Interested in change in contraception, would like to have the [place]  IUD. She had Mirena IUD and had cramping all the time and had removed. Has read about Rutha Bouchard and is aware is smaller feels this is a good choice. Would like to have STD screening to make sure all OK.  Sees Psychiatrist for Lithium management for Bipolar. Admits to some LSD use and marijuana. Has started exercising and feels better. No other health concerns today.  Patient's last menstrual period was 02/04/2018 (approximate).          Sexually active: Yes.    The current method of family planning is condoms sometimes.    Exercising: Yes.    weights and cardio. Smoker:  Yes, Recreational drug use: marijuana & LSD  Health Maintenance: Pap:  never History of Abnormal Pap: no MMG:  none Self Breast exams: yes Colonoscopy:  none BMD:   none TDaP:  unsure Shingles: no Pneumonia: no Hep C and HIV: both neg 2017 Labs: poct upt-neg   reports that she has been smoking cigarettes.  She has never used smokeless tobacco. She reports that she drinks about 0.6 oz of alcohol per week. She reports that she has current or past drug history. Drugs: Marijuana and LSD.  Past Medical History:  Diagnosis Date  . ADHD (attention deficit hyperactivity disorder)   . Anxiety   . Bipolar 2 disorder (HCC)   . Depression     Past Surgical History:  Procedure Laterality Date  . ADENOIDECTOMY  ~2007  . HERNIA REPAIR     as child  . ROOT CANAL      Current Outpatient Medications  Medication Sig Dispense Refill  . lithium carbonate (LITHOBID) 300 MG CR tablet Take 4 tablets by mouth at bedtime.     No current facility-administered medications for this visit.     No family history on file.  ROS:  Pertinent items are noted in HPI.  Otherwise, a comprehensive ROS was negative.  Exam:   BP 112/70 (BP  Location: Right Arm, Patient Position: Sitting, Cuff Size: Normal)   Pulse 76   Resp 18   Ht  (1.626 m)   Wt 163 lb 6.4 oz (74.1 kg)   LMP 02/04/2018 (Approximate)   BMI 28.05 kg/m  Height:  (162.6 cm) Ht Readings from Last 3 Encounters:  03/10/18  (1.626 m)  04/24/17  (1.626 m)  10/22/16  (1.626 m)    General appearance: alert, cooperative and appears stated age Head: Normocephalic, without obvious abnormality, atraumatic Neck: no adenopathy, supple, symmetrical, trachea midline and thyroid normal to inspection and palpation Lungs: clear to auscultation bilaterally Breasts: normal appearance, no masses or tenderness, No nipple retraction or dimpling, No nipple discharge or bleeding, No axillary or supraclavicular adenopathy, Taught monthly breast self examination Heart: regular rate and rhythm Abdomen: soft, non-tender; no masses,  no organomegaly Extremities: extremities normal, atraumatic, no cyanosis or edema Skin: Skin color, texture, turgor normal. No rashes or lesions Lymph nodes: Cervical, supraclavicular, and axillary nodes normal. No abnormal inguinal nodes palpated Neurologic: Grossly normal   Pelvic: External genitalia:  no lesions              Urethra:  normal appearing urethra with no masses, tenderness or lesions              Bartholin's and Skene's:  normal                 Vagina: normal appearing vagina with normal color and discharge, no lesions              Cervix: no cervical motion tenderness, no lesions and nulliparous appearance              Pap taken: Yes.   Bimanual Exam:  Uterus:  normal size, contour, position, consistency, mobility, non-tender and anteflexed              Adnexa: normal adnexa and no mass, fullness, tenderness               Rectovaginal: Confirms               Anus:  normal sphincter tone, no lesions  Chaperone present: yes  A:  Well Woman with normal exam  Contraception condoms not consistent  Desires  contraception change to [place] IUD  Illicit drug sporadic use  Bipolar with Psychiatrist management  STD screening P:   Reviewed health and wellness pertinent to exam  Discussed importance of consistent condom use for contraception and STD protection.  Discussed risks/benefits of Rutha Bouchard IUD and expectations. Patient would like to switch. Discussed consistent condom use and would need to call on day 1-5 of period for insertion. She will be called with insurance information. Declines taking insurance sheet for her information. Aware if concerns with pregnancy will not be inserted.  Discussed risk of drug use and concerns with her medication use.  Continue follow up with MD regarding medication use.  Labs: Affirm, GC/Chlamydia, STD panel, Hep C  Pap smear: yes   counseled on breast self exam, STD prevention, HIV risk factors and prevention, family planning choices, adequate intake of calcium and vitamin D, diet and exercise  return annually or prn  An After Visit Summary was printed and given to the patient.

## 2018-03-11 LAB — VAGINITIS/VAGINOSIS, DNA PROBE
CANDIDA SPECIES: NEGATIVE
Gardnerella vaginalis: NEGATIVE
Trichomonas vaginosis: NEGATIVE

## 2018-03-11 LAB — HEP, RPR, HIV PANEL
HEP B S AG: NEGATIVE
HIV Screen 4th Generation wRfx: NONREACTIVE
RPR Ser Ql: NONREACTIVE

## 2018-03-11 LAB — HEPATITIS C ANTIBODY: Hep C Virus Ab: <0.1 s/co ratio (ref 0.0–0.9)

## 2018-03-11 LAB — GC/CHLAMYDIA PROBE AMP
CHLAMYDIA, DNA PROBE: NEGATIVE
NEISSERIA GONORRHOEAE BY PCR: NEGATIVE

## 2018-03-12 LAB — CYTOLOGY - PAP

## 2018-03-15 ENCOUNTER — Telehealth: Payer: Self-pay | Admitting: Certified Nurse Midwife

## 2018-03-15 MED ORDER — MISOPROSTOL 200 MCG PO TABS: ORAL_TABLET | ORAL | 0 refills | Status: DC

## 2018-03-15 NOTE — Telephone Encounter (Signed)
Patient called to report she started her menstrual cycle yesterday and she is ready to schedule Kyleena insertion. She'd like to come in tomorrow or Wednesday before 1:00 PM, if possible.  Cc: Suzy/Rosa

## 2018-03-15 NOTE — Telephone Encounter (Signed)
Patient called and requested a medication for her IUD insertion on 03/16/18. She said, "I'll need that medication that helps soften my cervix before the procedure sent in please."

## 2018-03-15 NOTE — Telephone Encounter (Signed)
cytotec script sent to the pharmacy. Called patient and informed her.

## 2018-03-15 NOTE — Telephone Encounter (Signed)
Dr.Jertson, patient is requesting Cytotec. Okay to send in Cytotec 200 mcg #2 0RF?

## 2018-03-15 NOTE — Telephone Encounter (Signed)
Spoke with patient. Patient states that she started her menses yesterday 5/19 and would like to schedule IUD insertion. Appointment scheduled for tomorrow 03/16/2018 at 1 pm with Dr.Jertson. Pre procedure instructions given.  Motrin instructions given. Motrin=Advil=Ibuprofen, 800 mg one hour before appointment. Eat a meal and hydrate well before appointment. Patient verbalizes understanding.  Routing to provider for final review. Patient agreeable to disposition. Will close encounter.

## 2018-03-16 ENCOUNTER — Encounter: Payer: Self-pay | Admitting: Obstetrics and Gynecology

## 2018-03-16 ENCOUNTER — Telehealth: Payer: Self-pay | Admitting: *Deleted

## 2018-03-16 ENCOUNTER — Other Ambulatory Visit: Payer: Self-pay

## 2018-03-16 ENCOUNTER — Ambulatory Visit (INDEPENDENT_AMBULATORY_CARE_PROVIDER_SITE_OTHER): Payer: BLUE CROSS/BLUE SHIELD | Admitting: Obstetrics and Gynecology

## 2018-03-16 VITALS — BP 110/60 | HR 80 | Resp 16 | Wt 160.0 lb

## 2018-03-16 DIAGNOSIS — R87612 Low grade squamous intraepithelial lesion on cytologic smear of cervix (LGSIL): Secondary | ICD-10-CM | POA: Diagnosis not present

## 2018-03-16 DIAGNOSIS — Z01812 Encounter for preprocedural laboratory examination: Secondary | ICD-10-CM

## 2018-03-16 DIAGNOSIS — Z3009 Encounter for other general counseling and advice on contraception: Secondary | ICD-10-CM

## 2018-03-16 DIAGNOSIS — Z30430 Encounter for insertion of intrauterine contraceptive device: Secondary | ICD-10-CM

## 2018-03-16 HISTORY — PX: INTRAUTERINE DEVICE (IUD) INSERTION: SHX5877

## 2018-03-16 LAB — POCT URINE PREGNANCY: PREG TEST UR: NEGATIVE

## 2018-03-16 NOTE — Patient Instructions (Signed)
IUD Post-procedure Instructions Cramping is common.  You may take Ibuprofen, Aleve, or Tylenol for the cramping.  This should resolve within 24 hours.   You may have a small amount of spotting.  You should wear a mini pad for the next few days. You may have intercourse in 24 hours. You need to call the office if you have any pelvic pain, fever, heavy bleeding, or foul smelling vaginal discharge. Shower or bathe as normal Use back up contraception for one week 

## 2018-03-16 NOTE — Telephone Encounter (Signed)
Notes recorded by Leda Min, RN on 03/16/2018 at 12:26 PM EDT Unable to reach, mailbox full, unable to leave message. ------  Notes recorded by Verner Chol, CNM on 03/16/2018 at 7:45 AM EDT Pap smear is LSIL due to age no further evaluation but needs repeat pap smear in one year 08

## 2018-03-16 NOTE — Telephone Encounter (Signed)
Patient was informed of her pap at her visit today, see note.

## 2018-03-16 NOTE — Progress Notes (Signed)
GYNECOLOGY  VISIT   HPI: 22 y.o.   Single  Caucasian  female   G0P0000 with Patient's last menstrual period was 03/14/2018.   here for [facility] IUD insertion  Previously had a mirena and had pain. Wants to try a [place].  Just had negative GC/CT testing.  Baseline cycles irregular, thinks it's because she has taken plan B several times.  Mostly gets a cycle monthly x 3-4 days. Heavy for 2 days. Cramps start a week prior to her cycle, intermittent and tolerable.  She has bipolar disorder and borderline personality disorder, currently doing well with her medication.   GYNECOLOGIC HISTORY: Patient's last menstrual period was 03/14/2018. Contraception:none  Menopausal hormone therapy: none         OB History    Gravida  0   Para  0   Term  0   Preterm  0   AB  0   Living  0     SAB  0   TAB  0   Ectopic  0   Multiple  0   Live Births                 Patient Active Problem List   Diagnosis Date Noted  . Dysmenorrhea 06/02/2016  . Abdominal discomfort, generalized 05/05/2016  . Depression 04/21/2016    Past Medical History:  Diagnosis Date  . ADHD (attention deficit hyperactivity disorder)   . Anxiety   . Bipolar 2 disorder (HCC)   . Depression     Past Surgical History:  Procedure Laterality Date  . ADENOIDECTOMY  ~2007  . HERNIA REPAIR     as child  . ROOT CANAL      Current Outpatient Medications  Medication Sig Dispense Refill  . lithium carbonate (LITHOBID) 300 MG CR tablet Take 4 tablets by mouth at bedtime.    . misoprostol (CYTOTEC) 200 MCG tablet Place 2 tablets vaginally 6-12 hours prior to IUD insertion (do not wake up during the night to place) 2 tablet 0  . levocetirizine (XYZAL) 5 MG tablet TK 1 T PO QD IN THE EVE  3  . omeprazole (PRILOSEC) 40 MG capsule TK 1 C PO QD  0   No current facility-administered medications for this visit.      ALLERGIES: Menthol and Tape  History reviewed. No pertinent family history.  Social History    Socioeconomic History  . Marital status: Single    Spouse name: Not on file  . Number of children: Not on file  . Years of education: Not on file  . Highest education level: Not on file  Occupational History  . Not on file  Social Needs  . Financial resource strain: Not on file  . Food insecurity:    Worry: Not on file    Inability: Not on file  . Transportation needs:    Medical: Not on file    Non-medical: Not on file  Tobacco Use  . Smoking status: Current Every Day Smoker    Types: Cigarettes  . Smokeless tobacco: Never Used  . Tobacco comment: smokes 1 cigarette/week  Substance and Sexual Activity  . Alcohol use: Yes    Alcohol/week: 0.6 oz    Types: 1 Glasses of wine per week  . Drug use: Yes    Types: Marijuana, LSD  . Sexual activity: Yes    Birth control/protection: Injection, None, Condom    Comment: condoms most of the time  Lifestyle  . Physical activity:  Days per week: Not on file    Minutes per session: Not on file  . Stress: Not on file  Relationships  . Social connections:    Talks on phone: Not on file    Gets together: Not on file    Attends religious service: Not on file    Active member of club or organization: Not on file    Attends meetings of clubs or organizations: Not on file    Relationship status: Not on file  . Intimate partner violence:    Fear of current or ex partner: Not on file    Emotionally abused: Not on file    Physically abused: Not on file    Forced sexual activity: Not on file  Other Topics Concern  . Not on file  Social History Narrative  . Not on file    Review of Systems  Constitutional: Negative.   HENT: Negative.   Eyes: Negative.   Respiratory: Negative.   Cardiovascular: Negative.   Gastrointestinal: Negative.   Genitourinary: Negative.   Musculoskeletal: Negative.   Skin: Negative.   Neurological: Negative.   Endo/Heme/Allergies: Negative.   Psychiatric/Behavioral: Negative.     PHYSICAL  EXAMINATION:    BP 110/60 (BP Location: Right Arm, Patient Position: Sitting, Cuff Size: Normal)   Pulse 80   Resp 16   Wt 160 lb (72.6 kg)   LMP 03/14/2018   BMI 27.46 kg/m     General appearance: alert, cooperative and appears stated age   Pelvic: External genitalia:  no lesions              Urethra:  normal appearing urethra with no masses, tenderness or lesions              Bartholins and Skenes: normal                 Vagina: normal appearing vagina with normal color and discharge, no lesions              Cervix:no lesions  The risks of the kyleena IUD were reviewed with the patient, including infection, abnormal bleeding and uterine perfortion. Consent was signed.  A speculum was placed in the vagina, the cervix was cleansed with betadine. A tenaculum was placed on the cervix, the uterus sounded to 8 cm. The cervix was dilated to a 4 hagar dilator  The kyleena IUD was inserted without difficulty. The string were cut to 3-4 cm. The tenaculum was removed. Slight oozing from the tenaculum site was stopped with pressure.   The patient tolerated the procedure well.    Chaperone was present for exam.  ASSESSMENT Contraception management Kyleena IUD insertion    PLAN F/U in one month Call with any concerns   An After Visit Summary was printed and given to the patient.  CC: Sara Chu, CNM  Addendum: reviewed with patient her LSIL pap and need for f/u pap in one month.

## 2018-04-13 ENCOUNTER — Ambulatory Visit: Payer: BLUE CROSS/BLUE SHIELD | Admitting: Obstetrics and Gynecology

## 2018-04-13 ENCOUNTER — Encounter: Payer: Self-pay | Admitting: Obstetrics and Gynecology

## 2018-04-13 ENCOUNTER — Other Ambulatory Visit: Payer: Self-pay

## 2018-04-13 VITALS — BP 102/60 | HR 76 | Resp 14 | Wt 156.0 lb

## 2018-04-13 DIAGNOSIS — R102 Pelvic and perineal pain: Secondary | ICD-10-CM

## 2018-04-13 LAB — POCT URINE PREGNANCY: PREG TEST UR: NEGATIVE

## 2018-04-13 MED ORDER — NAPROXEN SODIUM 550 MG PO TABS: ORAL_TABLET | ORAL | 2 refills | Status: AC

## 2018-04-13 NOTE — Addendum Note (Signed)
Addended by: Tobi BastosJERTSON, JILL E on: 04/13/2018 04:35 PM   Modules accepted: Orders

## 2018-04-13 NOTE — Progress Notes (Signed)
GYNECOLOGY  VISIT   HPI: 22 y.o.   Single  Caucasian  female   G0P0000 with Patient's last menstrual period was 03/14/2018.   here c/o cramping since having her Kyleena IUD placed last month. She had slight cramping after insertion. On 04/02/18 she had vigorous sex. On 04/03/18 she started having intermittent stabbing in her mid pelvis, radiating to her legs and back. Helped with ibuprofen. Progressively got worse until today. Today the pain was up to a 10/10 in severity. Pain started this morning at 10:30, progressively worsened. Improved by about 11, but has been tender to day.  She has the same partner.   The patient previously had a mirena and had issues with pain, started a year after insertion, no cysts on ultrasound.    GYNECOLOGIC HISTORY: Patient's last menstrual period was 03/14/2018. Contraception:IUD Menopausal hormone therapy: none         OB History    Gravida  0   Para  0   Term  0   Preterm  0   AB  0   Living  0     SAB  0   TAB  0   Ectopic  0   Multiple  0   Live Births                 Patient Active Problem List   Diagnosis Date Noted  . Dysmenorrhea 06/02/2016  . Abdominal discomfort, generalized 05/05/2016  . Depression 04/21/2016    Past Medical History:  Diagnosis Date  . ADHD (attention deficit hyperactivity disorder)   . Anxiety   . Bipolar 2 disorder (HCC)   . Depression     Past Surgical History:  Procedure Laterality Date  . ADENOIDECTOMY  ~2007  . HERNIA REPAIR     as child  . INTRAUTERINE DEVICE (IUD) INSERTION  03/16/2018   Kyleena   . ROOT CANAL      Current Outpatient Medications  Medication Sig Dispense Refill  . LAVENDER OIL PO Take by mouth.    . levocetirizine (XYZAL) 5 MG tablet TK 1 T PO QD IN THE EVE  3  . lithium carbonate (LITHOBID) 300 MG CR tablet Take 4 tablets by mouth at bedtime.    . Omega 3-6-9 Fatty Acids (OMEGA-3 FUSION PO) Take by mouth.    Marland Kitchen. omeprazole (PRILOSEC) 40 MG capsule TK 1 C PO QD  0    No current facility-administered medications for this visit.      ALLERGIES: Menthol and Tape  History reviewed. No pertinent family history.  Social History   Socioeconomic History  . Marital status: Single    Spouse name: Not on file  . Number of children: Not on file  . Years of education: Not on file  . Highest education level: Not on file  Occupational History  . Not on file  Social Needs  . Financial resource strain: Not on file  . Food insecurity:    Worry: Not on file    Inability: Not on file  . Transportation needs:    Medical: Not on file    Non-medical: Not on file  Tobacco Use  . Smoking status: Current Every Day Smoker    Types: Cigarettes  . Smokeless tobacco: Never Used  . Tobacco comment: smokes 1 cigarette/week  Substance and Sexual Activity  . Alcohol use: Yes    Alcohol/week: 0.6 oz    Types: 1 Glasses of wine per week  . Drug use: Yes  Types: Marijuana, LSD  . Sexual activity: Yes    Partners: Male, Female    Birth control/protection: IUD    Comment: Kyleena IUD placed 03-16-18  Lifestyle  . Physical activity:    Days per week: Not on file    Minutes per session: Not on file  . Stress: Not on file  Relationships  . Social connections:    Talks on phone: Not on file    Gets together: Not on file    Attends religious service: Not on file    Active member of club or organization: Not on file    Attends meetings of clubs or organizations: Not on file    Relationship status: Not on file  . Intimate partner violence:    Fear of current or ex partner: Not on file    Emotionally abused: Not on file    Physically abused: Not on file    Forced sexual activity: Not on file  Other Topics Concern  . Not on file  Social History Narrative  . Not on file    Review of Systems  Constitutional: Negative.   HENT: Negative.   Eyes: Negative.   Respiratory: Negative.   Cardiovascular: Negative.   Gastrointestinal: Negative.   Genitourinary:        Cramping with IUD  Musculoskeletal: Negative.   Skin: Negative.   Neurological: Negative.   Endo/Heme/Allergies: Negative.   Psychiatric/Behavioral: Negative.   No fever, no change in baseline diarrhea, no emesis  PHYSICAL EXAMINATION:    BP 102/60 (BP Location: Right Arm, Patient Position: Sitting, Cuff Size: Normal)   Pulse 76   Resp 14   Wt 156 lb (70.8 kg)   LMP 03/14/2018   BMI 26.78 kg/m     General appearance: alert, cooperative and appears stated age Abdomen: soft, non-tender; non distended, no masses,  no organomegaly  Pelvic: External genitalia:  no lesions              Urethra:  normal appearing urethra with no masses, tenderness or lesions              Bartholins and Skenes: normal                 Vagina: normal appearing vagina with normal color and discharge, no lesions              Cervix: no lesions and IUD string 3-4 cm              Bimanual Exam:  Uterus:  normal size, contour, position, consistency, mobility, non-tender and anteverted              Adnexa: no mass, fullness, tenderness              Rectovaginal: Yes.  .  Confirms.              Anus:  normal sphincter tone, no lesions  Chaperone was present for exam.  ASSESSMENT Pelvic pain IUD strings normal No findings on pelvic exam    PLAN UPT negative Genprobe Return for GYN ultrasound Anaprox for pain (aware to take it only as needed, can increase the lithium level) Call with worsening pain   An After Visit Summary was printed and given to the patient.

## 2018-04-14 LAB — GC/CHLAMYDIA PROBE AMP
CHLAMYDIA, DNA PROBE: NEGATIVE
NEISSERIA GONORRHOEAE BY PCR: NEGATIVE

## 2018-04-15 ENCOUNTER — Encounter: Payer: Self-pay | Admitting: Obstetrics and Gynecology

## 2018-04-15 ENCOUNTER — Ambulatory Visit: Payer: BLUE CROSS/BLUE SHIELD | Admitting: Obstetrics and Gynecology

## 2018-04-15 ENCOUNTER — Ambulatory Visit (INDEPENDENT_AMBULATORY_CARE_PROVIDER_SITE_OTHER): Payer: BLUE CROSS/BLUE SHIELD

## 2018-04-15 ENCOUNTER — Other Ambulatory Visit: Payer: Self-pay

## 2018-04-15 VITALS — BP 108/64 | HR 60 | Resp 14 | Wt 156.0 lb

## 2018-04-15 DIAGNOSIS — R102 Pelvic and perineal pain: Secondary | ICD-10-CM | POA: Diagnosis not present

## 2018-04-15 NOTE — Progress Notes (Signed)
GYNECOLOGY  VISIT   HPI: 22 y.o.   Single  Caucasian  female   G0P0000 with No LMP recorded.   here for follow up pelvic pain. The patient was seen 2 days ago with pelvic pain that started on 04/03/18. She had a kyleena IUD placed  On 03/16/18. This week she had a negative UPT, and a negative genprobe. She had a normal pelvic exam a few days. She continues to have pain, not as bad.  She has some diarrhea from lithium. She is having loose stools 2 x a day. She is seeing her primary and thinks she is going to f/u with GI.  She started spotting yesterday, thinks this may be her cycle.    GYNECOLOGIC HISTORY: No LMP recorded. Contraception: IUD  Menopausal hormone therapy: None         OB History    Gravida  0   Para  0   Term  0   Preterm  0   AB  0   Living  0     SAB  0   TAB  0   Ectopic  0   Multiple  0   Live Births                 Patient Active Problem List   Diagnosis Date Noted  . Dysmenorrhea 06/02/2016  . Abdominal discomfort, generalized 05/05/2016  . Depression 04/21/2016    Past Medical History:  Diagnosis Date  . ADHD (attention deficit hyperactivity disorder)   . Anxiety   . Bipolar 2 disorder (HCC)   . Depression     Past Surgical History:  Procedure Laterality Date  . ADENOIDECTOMY  ~2007  . HERNIA REPAIR     as child  . INTRAUTERINE DEVICE (IUD) INSERTION  03/16/2018   Kyleena   . ROOT CANAL      Current Outpatient Medications  Medication Sig Dispense Refill  . LAVENDER OIL PO Take by mouth.    . levocetirizine (XYZAL) 5 MG tablet TK 1 T PO QD IN THE EVE  3  . lithium carbonate (LITHOBID) 300 MG CR tablet Take 4 tablets by mouth at bedtime.    . naproxen sodium (ANAPROX DS) 550 MG tablet 1 tablet q 12 hours prn pain 30 tablet 2  . Omega 3-6-9 Fatty Acids (OMEGA-3 FUSION PO) Take by mouth.    Marland Kitchen. omeprazole (PRILOSEC) 40 MG capsule TK 1 C PO QD  0   No current facility-administered medications for this visit.      ALLERGIES:  Menthol and Tape  History reviewed. No pertinent family history.  Social History   Socioeconomic History  . Marital status: Single    Spouse name: Not on file  . Number of children: Not on file  . Years of education: Not on file  . Highest education level: Not on file  Occupational History  . Not on file  Social Needs  . Financial resource strain: Not on file  . Food insecurity:    Worry: Not on file    Inability: Not on file  . Transportation needs:    Medical: Not on file    Non-medical: Not on file  Tobacco Use  . Smoking status: Current Every Day Smoker    Types: Cigarettes  . Smokeless tobacco: Never Used  . Tobacco comment: smokes 1 cigarette/week  Substance and Sexual Activity  . Alcohol use: Yes    Alcohol/week: 0.6 oz    Types: 1 Glasses of wine per  week  . Drug use: Yes    Types: Marijuana, LSD  . Sexual activity: Yes    Partners: Male, Female    Birth control/protection: IUD    Comment: Kyleena IUD placed 03-16-18  Lifestyle  . Physical activity:    Days per week: Not on file    Minutes per session: Not on file  . Stress: Not on file  Relationships  . Social connections:    Talks on phone: Not on file    Gets together: Not on file    Attends religious service: Not on file    Active member of club or organization: Not on file    Attends meetings of clubs or organizations: Not on file    Relationship status: Not on file  . Intimate partner violence:    Fear of current or ex partner: Not on file    Emotionally abused: Not on file    Physically abused: Not on file    Forced sexual activity: Not on file  Other Topics Concern  . Not on file  Social History Narrative  . Not on file    Review of Systems  Constitutional: Negative.   HENT: Negative.   Eyes: Negative.   Respiratory: Negative.   Cardiovascular: Negative.   Gastrointestinal: Negative.   Genitourinary: Negative.   Musculoskeletal: Negative.   Skin: Positive for rash.       Red rash  on both fore arms   Neurological: Negative.   Endo/Heme/Allergies: Negative.   Psychiatric/Behavioral: Negative.     PHYSICAL EXAMINATION:    BP 108/64 (BP Location: Right Arm, Patient Position: Sitting, Cuff Size: Normal)   Pulse 60   Resp 14   Wt 156 lb (70.8 kg)   BMI 26.78 kg/m     General appearance: alert, cooperative and appears stated age  Pelvic ultrasound reviewed with the patient. Ultrasound is normal with IUD in place  ASSESSMENT Pelvic pain, negative UPT, negative genprobe, negative ultrasound, not tender on exam.     PLAN The patient will keep track of her pain, let me know if it doesn't improve. I'm not convinced that the IUD is causing her pain, but if her pain continues and there is no other explanation, could consider removal.    An After Visit Summary was printed and given to the patient.

## 2018-04-20 ENCOUNTER — Telehealth: Payer: Self-pay | Admitting: *Deleted

## 2018-04-20 NOTE — Telephone Encounter (Signed)
Call to patient. Advised patient our office received a "patient safety and health consideration" form from [employer] for adverse drug interaction of naproxen sodium and lithium. After review with Dr. Oscar LaJertson, Dr. Oscar LaJertson advised to call patient and make sure patient understood to use naproxen sparingly. Patient states she is "definitely aware" and that she has "not even used any of it yet." RN advised would update Dr. Oscar LaJertson. Patient aware to return call to office in future if any additional questions.   Routing to provider for final review. Patient agreeable to disposition. Will close encounter.

## 2018-04-22 ENCOUNTER — Ambulatory Visit: Payer: BLUE CROSS/BLUE SHIELD | Admitting: Obstetrics and Gynecology

## 2018-05-10 DIAGNOSIS — K219 Gastro-esophageal reflux disease without esophagitis: Secondary | ICD-10-CM | POA: Insufficient documentation

## 2018-05-10 DIAGNOSIS — J3501 Chronic tonsillitis: Secondary | ICD-10-CM | POA: Insufficient documentation

## 2018-05-11 ENCOUNTER — Telehealth: Payer: Self-pay | Admitting: Certified Nurse Midwife

## 2018-05-11 DIAGNOSIS — Z30432 Encounter for removal of intrauterine contraceptive device: Secondary | ICD-10-CM

## 2018-05-11 NOTE — Telephone Encounter (Signed)
Patient states that her IUD is painful and would like it to be removed.

## 2018-05-11 NOTE — Telephone Encounter (Signed)
Spoke with patient. Requesting to schedule IUD removal. Patient reports continued pelvic pain since insertion of kyleena IUD on 03/16/18. Would like to discuss switching to depo provera.   Procedure scheduled for 7/17 at 9 am with Dr. Oscar LaJertson. Patient aware will be called with benefits prior to appt.   Order placed for IUD removal.  Routing to provider for final review. Patient is agreeable to disposition. Will close encounter.  Cc: Leota Sauerseborah Leonard, CNM Suzy Jamal Collinixon, Rosa Davis

## 2018-05-12 ENCOUNTER — Ambulatory Visit (INDEPENDENT_AMBULATORY_CARE_PROVIDER_SITE_OTHER): Payer: BLUE CROSS/BLUE SHIELD | Admitting: Obstetrics and Gynecology

## 2018-05-12 ENCOUNTER — Other Ambulatory Visit: Payer: Self-pay

## 2018-05-12 ENCOUNTER — Encounter: Payer: Self-pay | Admitting: Obstetrics and Gynecology

## 2018-05-12 VITALS — BP 101/68 | HR 64 | Wt 156.6 lb

## 2018-05-12 DIAGNOSIS — Z30432 Encounter for removal of intrauterine contraceptive device: Secondary | ICD-10-CM | POA: Diagnosis not present

## 2018-05-12 DIAGNOSIS — Z3009 Encounter for other general counseling and advice on contraception: Secondary | ICD-10-CM

## 2018-05-12 DIAGNOSIS — Z30013 Encounter for initial prescription of injectable contraceptive: Secondary | ICD-10-CM

## 2018-05-12 MED ORDER — MEDROXYPROGESTERONE ACETATE 150 MG/ML IM SUSP
150.0000 mg | Freq: Once | INTRAMUSCULAR | Status: AC
  Administered 2018-05-12: 150 mg via INTRAMUSCULAR

## 2018-05-12 NOTE — Addendum Note (Signed)
Addended by: Blima LedgerMYERS, Hudson Lehmkuhl N on: 05/12/2018 09:48 AM   Modules accepted: Orders

## 2018-05-12 NOTE — Patient Instructions (Signed)

## 2018-05-12 NOTE — Progress Notes (Signed)
GYNECOLOGY  VISIT   HPI: 22 y.o.   Single  Caucasian  female   [id number] with Patient's last menstrual period was 04/13/2018 (exact date).   here for IUD removal. Wants to discuss new birth control options. The patient had a kyleena IUD placed in 5/19, she started having pelvic pain in 6/19. Negative UPT, negative genprobe, normal u/s IUD in place. She continues to have pain and wants the IUD removed. The pain is sporadic and unpredictable. She previously was on OCP's, not good about taking them. She tried the nuvaring and it kept falling out.  She tried depo-provera in the past, did fine with it.  Sexually active, same partner x 6 months. No using condoms.     GYNECOLOGIC HISTORY: Patient's last menstrual period was 04/13/2018 (exact date). Contraception:IUD Menopausal hormone therapy: None        OB History    Gravida  0   Para  0   Term  0   Preterm  0   AB  0   Living  0     SAB  0   TAB  0   Ectopic  0   Multiple  0   Live Births                 Patient Active Problem List   Diagnosis Date Noted  . Dysmenorrhea 06/02/2016  . Abdominal discomfort, generalized 05/05/2016  . Depression 04/21/2016    Past Medical History:  Diagnosis Date  . ADHD (attention deficit hyperactivity disorder)   . Anxiety   . Bipolar 2 disorder (HCC)   . Depression   . Hypothyroidism   . Thyroid nodule     Past Surgical History:  Procedure Laterality Date  . ADENOIDECTOMY  ~2007  . HERNIA REPAIR     as child  . INTRAUTERINE DEVICE (IUD) INSERTION  03/16/2018   Kyleena   . ROOT CANAL      Current Outpatient Medications  Medication Sig Dispense Refill  . clindamycin (CLEOCIN) 300 MG capsule Take 300 mg by mouth 3 (three) times daily.    Marland Kitchen LAVENDER OIL PO Take by mouth.    . levocetirizine (XYZAL) 5 MG tablet TK 1 T PO QD IN THE EVE  3  . Levonorgestrel (KYLEENA) 19.5 MG IUD by Intrauterine route.    . lithium carbonate (LITHOBID) 300 MG CR tablet Take 4 tablets by  mouth at bedtime.    . naproxen sodium (ANAPROX DS) 550 MG tablet 1 tablet q 12 hours prn pain 30 tablet 2  . Omega 3-6-9 Fatty Acids (OMEGA-3 FUSION PO) Take by mouth.    Marland Kitchen omeprazole (PRILOSEC) 40 MG capsule TK 1 C PO QD  0  . pantoprazole sodium (PROTONIX) 40 mg/20 mL PACK Take 40 mg by mouth daily.     No current facility-administered medications for this visit.      ALLERGIES: Menthol and Tape  Family History  Problem Relation Age of Onset  . Thyroid disease Maternal Grandmother     Social History   Socioeconomic History  . Marital status: Single    Spouse name: Not on file  . Number of children: Not on file  . Years of education: Not on file  . Highest education level: Not on file  Occupational History  . Not on file  Social Needs  . Financial resource strain: Not on file  . Food insecurity:    Worry: Not on file    Inability: Not on file  .  Transportation needs:    Medical: Not on file    Non-medical: Not on file  Tobacco Use  . Smoking status: Former Smoker    Types: Cigarettes  . Smokeless tobacco: Never Used  Substance and Sexual Activity  . Alcohol use: Yes    Alcohol/week: 0.6 oz    Types: 1 Glasses of wine per week  . Drug use: Yes    Types: Marijuana, LSD  . Sexual activity: Yes    Partners: Male, Female    Birth control/protection: IUD    Comment: Kyleena IUD placed 03-16-18  Lifestyle  . Physical activity:    Days per week: Not on file    Minutes per session: Not on file  . Stress: Not on file  Relationships  . Social connections:    Talks on phone: Not on file    Gets together: Not on file    Attends religious service: Not on file    Active member of club or organization: Not on file    Attends meetings of clubs or organizations: Not on file    Relationship status: Not on file  . Intimate partner violence:    Fear of current or ex partner: Not on file    Emotionally abused: Not on file    Physically abused: Not on file    Forced sexual  activity: Not on file  Other Topics Concern  . Not on file  Social History Narrative  . Not on file    Review of Systems  Constitutional: Negative.   HENT: Negative.   Eyes: Negative.   Respiratory: Negative.   Cardiovascular: Negative.   Gastrointestinal: Negative.   Genitourinary: Negative.   Musculoskeletal: Negative.   Skin: Negative.   Neurological: Negative.   Endo/Heme/Allergies: Negative.   Psychiatric/Behavioral: Negative.     PHYSICAL EXAMINATION:    BP 101/68 (BP Location: Right Arm, Patient Position: Sitting)   Pulse 64   Wt 156 lb 9.6 oz (71 kg)   LMP 04/13/2018 (Exact Date)   BMI 26.88 kg/m     General appearance: alert, cooperative and appears stated age  Pelvic: External genitalia:  no lesions              Urethra:  normal appearing urethra with no masses, tenderness or lesions              Bartholins and Skenes: normal                 Vagina: normal appearing vagina with normal color and discharge, no lesions              Cervix: no lesions, IUD string 2-3 cm. IUD removed with ringed forceps.   Chaperone was present for exam.  ASSESSMENT Pelvic pain, feels it is from the IUD. Wants the IUD removed Contraception, reviewed her options.     PLAN IUD removed Start Depo-Provera, reviewed side effects Use condoms for the next week Discussed the nexplanon, information given   An After Visit Summary was printed and given to the patient.  ~15 minutes face to face time of which over 50% was spent in counseling.

## 2018-06-04 ENCOUNTER — Other Ambulatory Visit: Payer: Self-pay | Admitting: Otolaryngology

## 2018-06-04 DIAGNOSIS — R591 Generalized enlarged lymph nodes: Secondary | ICD-10-CM

## 2018-06-15 ENCOUNTER — Ambulatory Visit
Admission: RE | Admit: 2018-06-15 | Discharge: 2018-06-15 | Disposition: A | Discharge status: Home / Self Care | Payer: BLUE CROSS/BLUE SHIELD | Source: Ambulatory Visit | Attending: Otolaryngology | Admitting: Otolaryngology

## 2018-06-15 DIAGNOSIS — R591 Generalized enlarged lymph nodes: Secondary | ICD-10-CM

## 2018-06-15 IMAGING — CT CT NECK W/ CM
4 of 6 series · 13 of 33 positions shown, 15 images · IV contrast (iopamidol)
Comparison: None.

CLINICAL DATA: 22-year-old female with bilateral palpable
abnormality suspicious for lymphadenopathy. Hoarseness since
[DATE]

EXAM:
CT NECK WITH CONTRAST
TECHNIQUE: Multidetector CT imaging of the neck was performed using the
standard protocol following the bolus administration of intravenous
contrast.
CONTRAST:  75mL [UT] IOPAMIDOL ([UT]) INJECTION 61%

[Series 3: neck · axial · 0.38mm/px · z∈[-222,-98]mm · 3 of 124 slices shown, 4 images]
[im 31/124  soft-tissue]
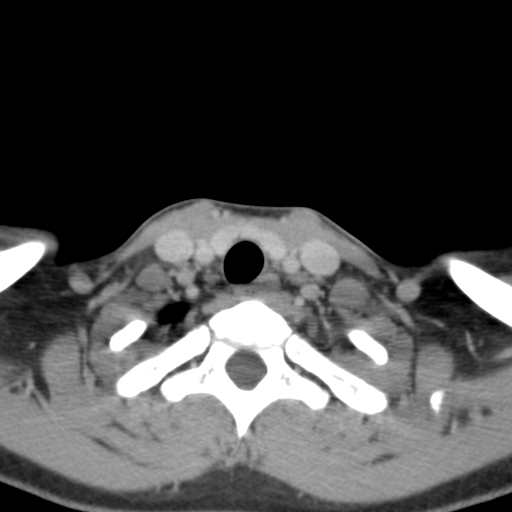
[im 31/124  bone]
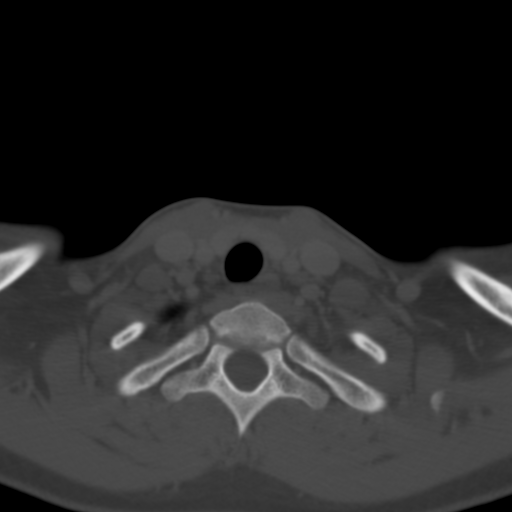
[im 62/124  bone]
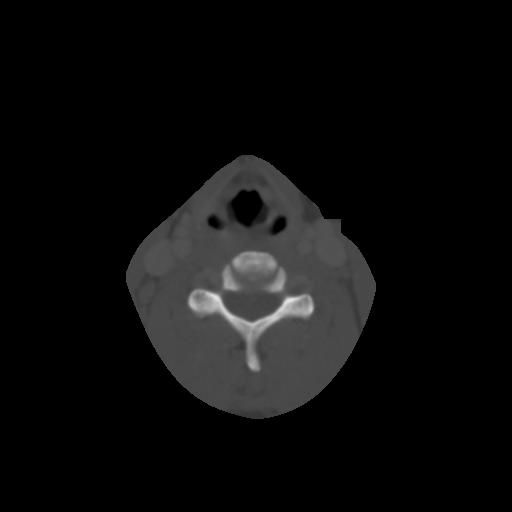
[im 93/124  bone]
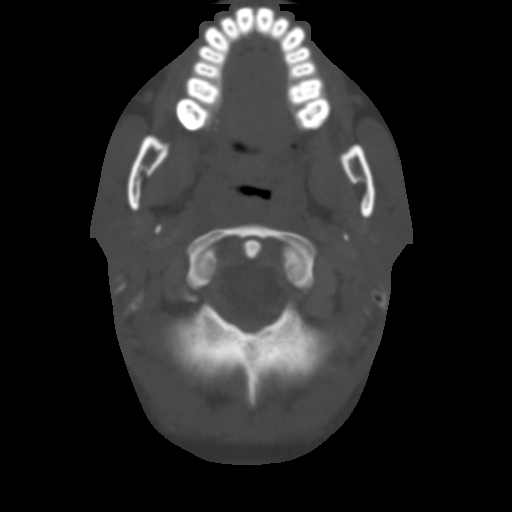

[Series 7: sag · sagittal · 0.42mm/px · 5 of 101 slices shown, 6 images]
[im 34/101  bone]
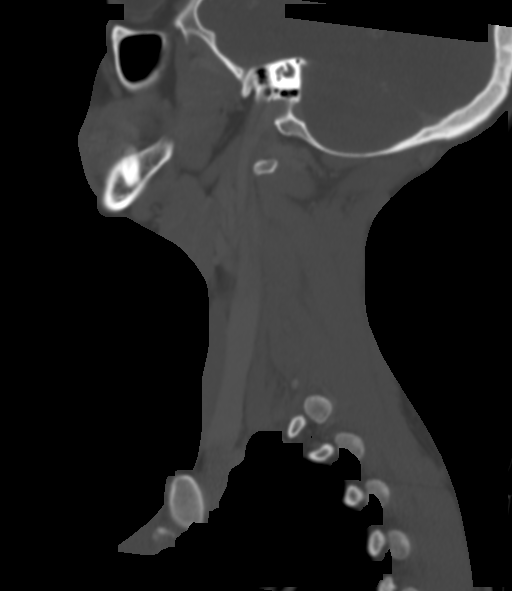
[im 42/101  bone]
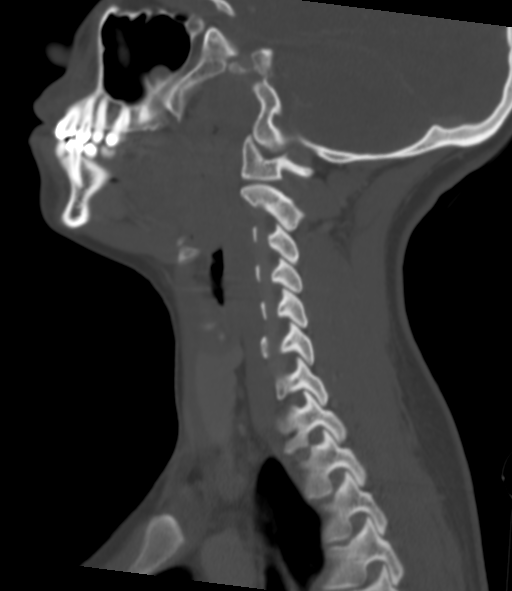
[im 51/101  soft-tissue]
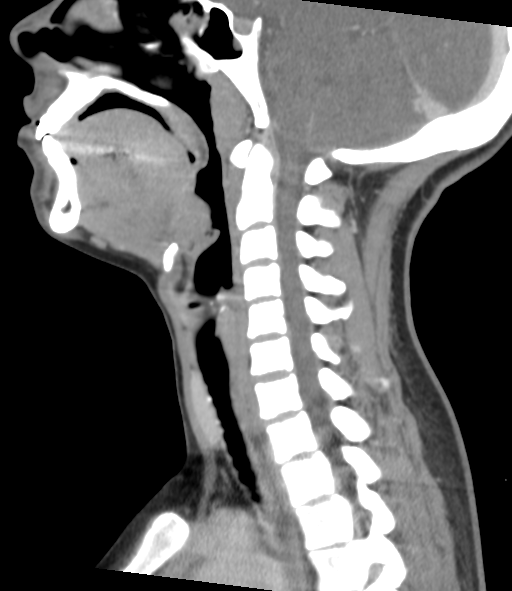
[im 51/101  bone]
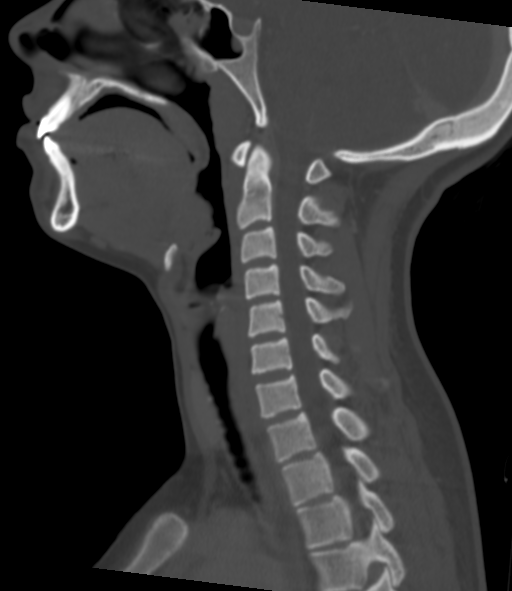
[im 59/101  bone]
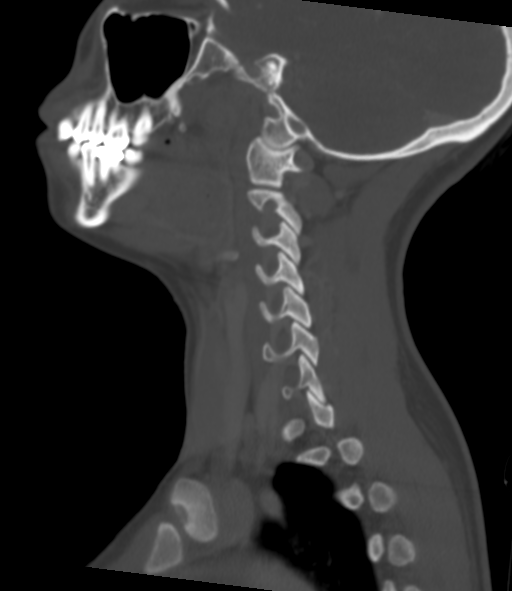
[im 67/101  bone]
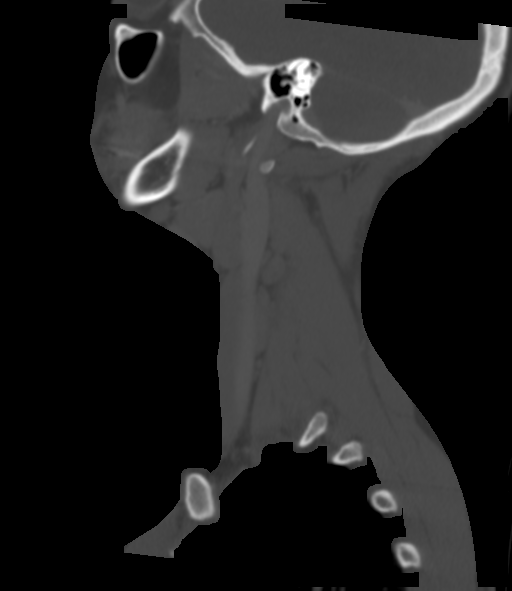

[Series 8: cor · coronal · 0.39mm/px · 3 of 101 slices shown]
[im 21/101  bone]
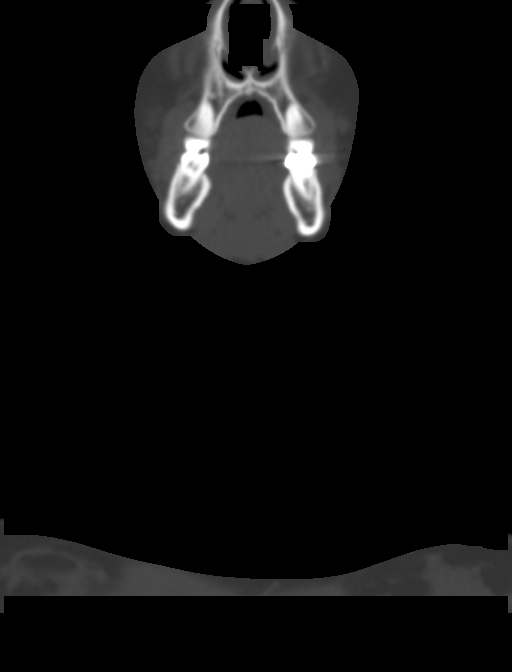
[im 41/101  bone]
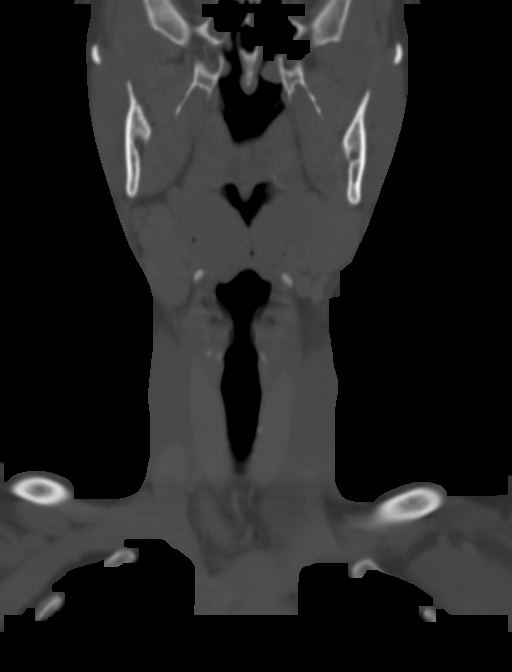
[im 61/101  bone]
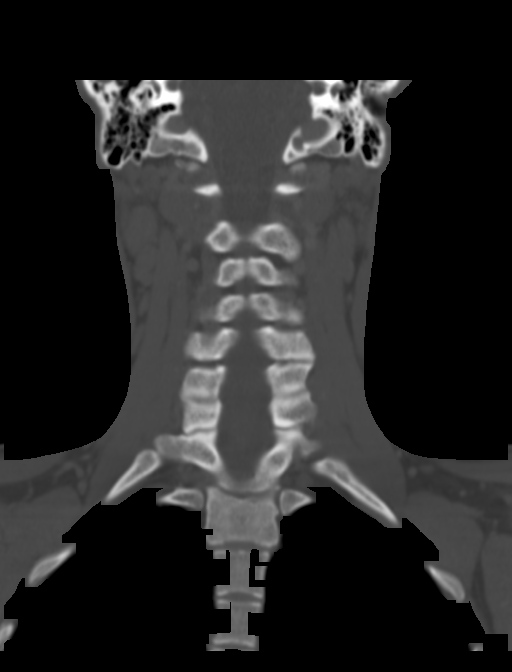

[Series 9: angled axial-oropharynx · axial · 0.39mm/px · z∈[-241,-180]mm · 2 of 124 slices shown]
[im 31/124  bone]
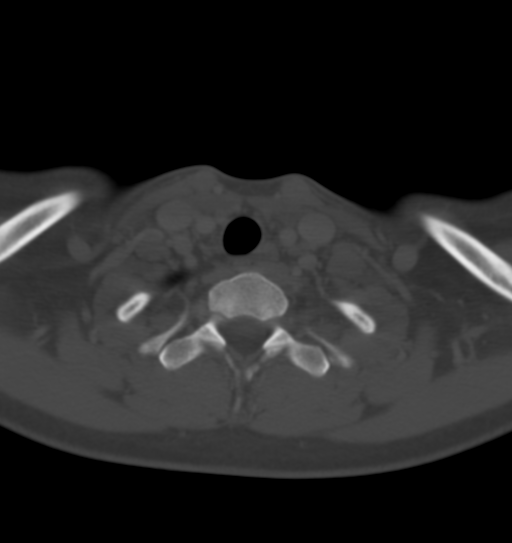
[im 62/124  bone]
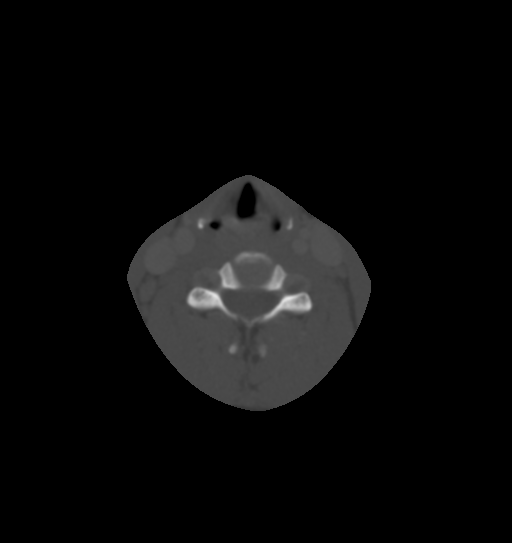

[13 of 33 positions shown; findings below may reference images not displayed]

FINDINGS: Pharynx and larynx: Symmetric and normal larynx. Negative
hypopharynx.

Effaced vallecula with bulky soft tissue most resembling lingual
tonsil hypertrophy.

Less soft tissue along the lateral pharyngeal walls which may
indicate surgically absent palatine tonsils, uncertain.

Adenoid soft tissues also mildly prominent but relatively symmetric.

Negative parapharyngeal and retropharyngeal spaces.

Salivary glands: Negative sublingual space. The submandibular glands
are symmetric and within normal limits. 1 of the marked palpable
areas of concern appears to correspond to the right submandibular
gland on series 3, image 58.

Bilateral parotid glands are normal.

Thyroid: Negative aside from a tiny 4 millimeter hypodense right
thyroid lobe nodule on series 3, image 81 which does not meet size
criteria for dedicated ultrasound follow-up.

Lymph nodes: The left-side marked area of clinical concern
corresponds to the anterior margin of the left sternocleidomastoid
muscle at or just below the hyoid bone (series 3 image 62). There is
a prominent nearby left level IIa lymph node measuring 11 millimeter
short axis (image 54). Prominent left level 2 B lymph nodes
individually measure up to 10 millimeters (image 44). Contralateral
right level II a and level 2 B lymph nodes measure up to 13
millimeters, and 11 millimeter short axis, respectively).

There are also mildly prominent but symmetric level 5 lymph nodes,
such as series 3, image 47 on the left.

The level 1, level 3, and level 4 lymph nodes are smaller and appear
normal for age.

Vascular: Major vascular structures in the neck and at the skull
base are patent and appear normal.

Limited intracranial: Negative.

Visualized orbits: Negative.

Mastoids and visualized paranasal sinuses: Mild chronic left lamina
papyracea fracture. Scattered mild paranasal sinus mucosal
thickening or small retention cysts. There is bubbly opacity in the
left posterior ethmoid air cell.

The bilateral tympanic cavities and mastoids are clear.

Skeleton: No osseous abnormality. There is a prominent central disc
protrusion at C5-C6 as seen on sagittal image 50. Suspect mild
associated spinal stenosis at that level.

Upper chest: Normal visible superior mediastinum including visible
residual thymus. No thoracic inlet or superior mediastinal
lymphadenopathy. Visible axillary lymph nodes are within normal
limits. Visible upper lungs are clear.
IMPRESSION: 1. Mild for age bilateral level 2 lymph node enlargement with no
heterogeneous, cystic or necrotic nodes. Other lymph node stations
appear normal for age. Favor these level 2 lymph nodes are either
reactive (see #2), or physiologic. Clinical follow-up recommended
with repeat Neck CT with IV contrast as necessary.
2. Bilateral lingual tonsil hypertrophy possibly in the setting of
previous palatine tonsillectomy. Mild adenoid enlargement for age.
This appearance could reflect acute URI.
3. Other bilateral neck soft tissues are normal.
4. C5-C6 central disc herniation which may be resulting in mild
spinal stenosis.

## 2018-06-15 MED ORDER — IOPAMIDOL (ISOVUE-300) INJECTION 61%
75.0000 mL | Freq: Once | INTRAVENOUS | Status: AC | PRN
  Administered 2018-06-15: 75 mL via INTRAVENOUS

## 2018-07-28 ENCOUNTER — Ambulatory Visit (INDEPENDENT_AMBULATORY_CARE_PROVIDER_SITE_OTHER): Payer: BLUE CROSS/BLUE SHIELD

## 2018-07-28 VITALS — BP 110/64 | HR 66 | Resp 14 | Ht 64.0 in | Wt 156.0 lb

## 2018-07-28 DIAGNOSIS — Z3042 Encounter for surveillance of injectable contraceptive: Secondary | ICD-10-CM | POA: Diagnosis not present

## 2018-07-28 MED ORDER — MEDROXYPROGESTERONE ACETATE 150 MG/ML IM SUSP
150.0000 mg | Freq: Once | INTRAMUSCULAR | Status: AC
  Administered 2018-07-28: 150 mg via INTRAMUSCULAR

## 2018-07-28 NOTE — Progress Notes (Signed)
Patient is here for Depo Provera Injection Patient is within Depo Provera Calender Limits yes last given 05/12/18  Next Depo Due between: Dec 18- Jan 1 Last AEX: 03/10/18 with DL AEX Scheduled: 02/03/80  Patient is aware when next depo is due  Pt tolerated Injection well RUOQ  Routed to provider for review, encounter closed.

## 2018-10-13 ENCOUNTER — Ambulatory Visit: Payer: BLUE CROSS/BLUE SHIELD

## 2018-10-14 ENCOUNTER — Ambulatory Visit (INDEPENDENT_AMBULATORY_CARE_PROVIDER_SITE_OTHER): Payer: BLUE CROSS/BLUE SHIELD

## 2018-10-14 VITALS — BP 110/70 | HR 68 | Resp 14 | Ht 64.0 in | Wt 162.0 lb

## 2018-10-14 DIAGNOSIS — Z3042 Encounter for surveillance of injectable contraceptive: Secondary | ICD-10-CM

## 2018-10-14 MED ORDER — MEDROXYPROGESTERONE ACETATE 150 MG/ML IM SUSP
150.0000 mg | Freq: Once | INTRAMUSCULAR | Status: AC
  Administered 2018-10-14: 150 mg via INTRAMUSCULAR

## 2018-10-14 NOTE — Progress Notes (Signed)
Patient is here for Depo Provera Injection Patient is within Depo Provera Calender Limits yes, 12/18-1/1 Next Depo Due between: 3/5-3/19 Last AEX: 03/10/18 DL AEX Scheduled: 1/47/825/21/20  Patient is aware when next depo is due  Pt tolerated Injection well in LUOQ.  Routed to provider for review, encounter closed.

## 2019-01-06 ENCOUNTER — Telehealth: Payer: Self-pay | Admitting: Certified Nurse Midwife

## 2019-01-06 ENCOUNTER — Ambulatory Visit: Payer: BLUE CROSS/BLUE SHIELD

## 2019-01-06 ENCOUNTER — Other Ambulatory Visit: Payer: Self-pay

## 2019-01-06 ENCOUNTER — Ambulatory Visit (INDEPENDENT_AMBULATORY_CARE_PROVIDER_SITE_OTHER): Payer: BLUE CROSS/BLUE SHIELD

## 2019-01-06 VITALS — BP 122/66 | HR 72 | Resp 14 | Ht 64.0 in | Wt 170.0 lb

## 2019-01-06 DIAGNOSIS — Z3042 Encounter for surveillance of injectable contraceptive: Secondary | ICD-10-CM

## 2019-01-06 MED ORDER — MEDROXYPROGESTERONE ACETATE 150 MG/ML IM SUSP
150.0000 mg | Freq: Once | INTRAMUSCULAR | Status: AC
  Administered 2019-01-06: 150 mg via INTRAMUSCULAR

## 2019-01-06 NOTE — Progress Notes (Signed)
Patient is here for Depo Provera Injection Patient is within Depo Provera Calender Limits yes Next Depo Due between: 3/29-6/12 Last AEX: 03/10/18 AEX Scheduled: 03/17/19  Patient is aware when next depo is due  Pt tolerated Injection well RUOQ  Routed to provider for review, encounter closed.

## 2019-01-06 NOTE — Telephone Encounter (Signed)
Patient did not keep her depo appointment for today. I tried to call her twice to reschedule but her voice mail is full.   Routing to provider for review.

## 2019-03-17 ENCOUNTER — Ambulatory Visit: Payer: BLUE CROSS/BLUE SHIELD | Admitting: Certified Nurse Midwife

## 2019-03-18 NOTE — Progress Notes (Deleted)
Patient is here for Depo Provera Injection Patient is within Depo Provera Calender Limits may 28-June 11 Next Depo Due between: aug 13-aug 27 Last AEX: 03-10-18 AEX Scheduled: cancelled  Patient is aware when next depo is due  Pt tolerated Injection well in LUOQ  Routed to provider for review, encounter closed.

## 2019-03-23 ENCOUNTER — Telehealth: Payer: Self-pay

## 2019-03-23 NOTE — Telephone Encounter (Signed)
Called patient to let her know that we cancelled her depo appt for 03-24-19 & scheduled her an aex with depo with dr Oscar La on 03-25-2019. Mailbox full. Try again.

## 2019-03-24 ENCOUNTER — Ambulatory Visit: Payer: BLUE CROSS/BLUE SHIELD

## 2019-03-24 NOTE — Telephone Encounter (Signed)
Patient is aware that her appt for aex & depo is tomorrow at 4pm with dr Oscar La

## 2019-03-25 ENCOUNTER — Other Ambulatory Visit (HOSPITAL_COMMUNITY)
Admission: RE | Admit: 2019-03-25 | Discharge: 2019-03-25 | Disposition: A | Discharge status: Home / Self Care | Payer: BLUE CROSS/BLUE SHIELD | Source: Ambulatory Visit | Attending: Obstetrics and Gynecology | Admitting: Obstetrics and Gynecology

## 2019-03-25 ENCOUNTER — Encounter: Payer: Self-pay | Admitting: Obstetrics and Gynecology

## 2019-03-25 ENCOUNTER — Ambulatory Visit: Payer: BLUE CROSS/BLUE SHIELD | Admitting: Obstetrics and Gynecology

## 2019-03-25 ENCOUNTER — Other Ambulatory Visit: Payer: Self-pay

## 2019-03-25 VITALS — BP 112/68 | HR 60 | Temp 99.1°F | Ht 64.5 in | Wt 182.8 lb

## 2019-03-25 DIAGNOSIS — Z87410 Personal history of cervical dysplasia: Secondary | ICD-10-CM | POA: Insufficient documentation

## 2019-03-25 DIAGNOSIS — Z124 Encounter for screening for malignant neoplasm of cervix: Secondary | ICD-10-CM | POA: Diagnosis not present

## 2019-03-25 DIAGNOSIS — Z01419 Encounter for gynecological examination (general) (routine) without abnormal findings: Secondary | ICD-10-CM

## 2019-03-25 DIAGNOSIS — Z3042 Encounter for surveillance of injectable contraceptive: Secondary | ICD-10-CM | POA: Diagnosis not present

## 2019-03-25 DIAGNOSIS — Z113 Encounter for screening for infections with a predominantly sexual mode of transmission: Secondary | ICD-10-CM | POA: Diagnosis not present

## 2019-03-25 MED ORDER — MEDROXYPROGESTERONE ACETATE 150 MG/ML IM SUSP
150.0000 mg | Freq: Once | INTRAMUSCULAR | Status: AC
  Administered 2019-03-25: 17:00:00 150 mg via INTRAMUSCULAR

## 2019-03-25 NOTE — Patient Instructions (Signed)
EXERCISE AND DIET:  We recommended that you start or continue a regular exercise program for good health. Regular exercise means any activity that makes your heart beat faster and makes you sweat.  We recommend exercising at least 30 minutes per day at least 3 days a week, preferably 4 or 5.  We also recommend a diet low in fat and sugar.  Inactivity, poor dietary choices and obesity can cause diabetes, heart attack, stroke, and kidney damage, among others.    ALCOHOL AND SMOKING:  Women should limit their alcohol intake to no more than 7 drinks/beers/glasses of wine (combined, not each!) per week. Moderation of alcohol intake to this level decreases your risk of breast cancer and liver damage. And of course, no recreational drugs are part of a healthy lifestyle.  And absolutely no smoking or even second hand smoke. Most people know smoking can cause heart and lung diseases, but did you know it also contributes to weakening of your bones? Aging of your skin?  Yellowing of your teeth and nails?  CALCIUM AND VITAMIN D:  Adequate intake of calcium and Vitamin D are recommended.  The recommendations for exact amounts of these supplements seem to change often, but generally speaking 1,000 mg of calcium (between diet and supplement) and 800 units of Vitamin D per day seems prudent. Certain women may benefit from higher intake of Vitamin D.  If you are among these women, your doctor will have told you during your visit.    PAP SMEARS:  Pap smears, to check for cervical cancer or precancers,  have traditionally been done yearly, although recent scientific advances have shown that most women can have pap smears less often.  However, every woman still should have a physical exam from her gynecologist every year. It will include a breast check, inspection of the vulva and vagina to check for abnormal growths or skin changes, a visual exam of the cervix, and then an exam to evaluate the size and shape of the uterus and  ovaries.  And after 23 years of age, a rectal exam is indicated to check for rectal cancers. We will also provide age appropriate advice regarding health maintenance, like when you should have certain vaccines, screening for sexually transmitted diseases, bone density testing, colonoscopy, mammograms, etc.   MAMMOGRAMS:  All women over 40 years old should have a yearly mammogram. Many facilities now offer a "3D" mammogram, which may cost around $50 extra out of pocket. If possible,  we recommend you accept the option to have the 3D mammogram performed.  It both reduces the number of women who will be called back for extra views which then turn out to be normal, and it is better than the routine mammogram at detecting truly abnormal areas.    COLON CANCER SCREENING: Now recommend starting at age 45. At this time colonoscopy is not covered for routine screening until 50. There are take home tests that can be done between 45-49.   COLONOSCOPY:  Colonoscopy to screen for colon cancer is recommended for all women at age 50.  We know, you hate the idea of the prep.  We agree, BUT, having colon cancer and not knowing it is worse!!  Colon cancer so often starts as a polyp that can be seen and removed at colonscopy, which can quite literally save your life!  And if your first colonoscopy is normal and you have no family history of colon cancer, most women don't have to have it again for   10 years.  Once every ten years, you can do something that may end up saving your life, right?  We will be happy to help you get it scheduled when you are ready.  Be sure to check your insurance coverage so you understand how much it will cost.  It may be covered as a preventative service at no cost, but you should check your particular policy.      Breast Self-Awareness Breast self-awareness means being familiar with how your breasts look and feel. It involves checking your breasts regularly and reporting any changes to your  health care provider. Practicing breast self-awareness is important. A change in your breasts can be a sign of a serious medical problem. Being familiar with how your breasts look and feel allows you to find any problems early, when treatment is more likely to be successful. All women should practice breast self-awareness, including women who have had breast implants. How to do a breast self-exam One way to learn what is normal for your breasts and whether your breasts are changing is to do a breast self-exam. To do a breast self-exam: Look for Changes  1. Remove all the clothing above your waist. 2. Stand in front of a mirror in a room with good lighting. 3. Put your hands on your hips. 4. Push your hands firmly downward. 5. Compare your breasts in the mirror. Look for differences between them (asymmetry), such as: ? Differences in shape. ? Differences in size. ? Puckers, dips, and bumps in one breast and not the other. 6. Look at each breast for changes in your skin, such as: ? Redness. ? Scaly areas. 7. Look for changes in your nipples, such as: ? Discharge. ? Bleeding. ? Dimpling. ? Redness. ? A change in position. Feel for Changes Carefully feel your breasts for lumps and changes. It is best to do this while lying on your back on the floor and again while sitting or standing in the shower or tub with soapy water on your skin. Feel each breast in the following way:  Place the arm on the side of the breast you are examining above your head.  Feel your breast with the other hand.  Start in the nipple area and make  inch (2 cm) overlapping circles to feel your breast. Use the pads of your three middle fingers to do this. Apply light pressure, then medium pressure, then firm pressure. The light pressure will allow you to feel the tissue closest to the skin. The medium pressure will allow you to feel the tissue that is a little deeper. The firm pressure will allow you to feel the tissue  close to the ribs.  Continue the overlapping circles, moving downward over the breast until you feel your ribs below your breast.  Move one finger-width toward the center of the body. Continue to use the  inch (2 cm) overlapping circles to feel your breast as you move slowly up toward your collarbone.  Continue the up and down exam using all three pressures until you reach your armpit.  Write Down What You Find  Write down what is normal for each breast and any changes that you find. Keep a written record with breast changes or normal findings for each breast. By writing this information down, you do not need to depend only on memory for size, tenderness, or location. Write down where you are in your menstrual cycle, if you are still menstruating. If you are having trouble noticing differences   in your breasts, do not get discouraged. With time you will become more familiar with the variations in your breasts and more comfortable with the exam. How often should I examine my breasts? Examine your breasts every month. If you are breastfeeding, the best time to examine your breasts is after a feeding or after using a breast pump. If you menstruate, the best time to examine your breasts is 5-7 days after your period is over. During your period, your breasts are lumpier, and it may be more difficult to notice changes. When should I see my health care provider? See your health care provider if you notice:  A change in shape or size of your breasts or nipples.  A change in the skin of your breast or nipples, such as a reddened or scaly area.  Unusual discharge from your nipples.  A lump or thick area that was not there before.  Pain in your breasts.  Anything that concerns you.  

## 2019-03-25 NOTE — Addendum Note (Signed)
Addended by: Ginny Forth on: 03/25/2019 04:49 PM   Modules accepted: Orders

## 2019-03-25 NOTE — Progress Notes (Addendum)
23 y.o. G0P0000 Single female here for annual exam.  She is on depo-provera. Previously had a kyleena IUD, removed secondary to pain. She wasn't good about taking OCP's and nuvaring fell out.  She loves the depo, it is the only contraception she doesn't feel "crampy or crazy on". She has gained 20 lbs, is okay with it.  She has seen a nutritionist, is on a healthy eating schedule. Not currently exercising. Sexually active, same partner x 1.5 years. Just occasional deep dyspareunia, positional.   Her Psychiatrist orders her labs and does her medication. Will get a copy    No LMP recorded. Patient has had an injection.          Sexually active: Yes.    The current method of family planning is Depo-Provera injections.    Exercising: Yes.    walking, light lifting Smoker:  no  Health Maintenance: Pap:  03/10/2018 LSIL History of Abnormal Pap: no TDaP:  UTD Gardasil: Completed all 3   reports that she has quit smoking. Her smoking use included cigarettes. She has never used smokeless tobacco. She reports current alcohol use of about 1.0 standard drinks of alcohol per week. She reports previous drug use. Works at [employer].   Past Medical History:  Diagnosis Date  . ADHD (attention deficit hyperactivity disorder)   . Anxiety   . Bipolar 2 disorder (HCC)   . CMV (cytomegalovirus infection) status positive (HCC)   . Depression   . History of Epstein-Barr virus infection   . Hypothyroidism   . Thyroid nodule     Past Surgical History:  Procedure Laterality Date  . ADENOIDECTOMY  ~2007  . HERNIA REPAIR     as child  . INTRAUTERINE DEVICE (IUD) INSERTION  03/16/2018   Kyleena   . ROOT CANAL      Current Outpatient Medications  Medication Sig Dispense Refill  . fluticasone (FLONASE) 50 MCG/ACT nasal spray Place into both nostrils daily.    Marland Kitchen levocetirizine (XYZAL) 5 MG tablet Take 5 mg by mouth every evening.    Marland Kitchen levothyroxine (SYNTHROID) 75 MCG tablet Take 75 mcg by mouth daily  before breakfast.    . lithium carbonate (LITHOBID) 300 MG CR tablet Take 4 tablets by mouth at bedtime.    . naproxen sodium (ANAPROX DS) 550 MG tablet 1 tablet q 12 hours prn pain 30 tablet 2  . olopatadine (PATANOL) 0.1 % ophthalmic solution 1 drop 2 (two) times daily.    . Omega 3-6-9 Fatty Acids (OMEGA-3 FUSION PO) Take by mouth.    . pantoprazole sodium (PROTONIX) 40 mg/20 mL PACK Take 40 mg by mouth daily.     No current facility-administered medications for this visit.     Family History  Problem Relation Age of Onset  . Thyroid disease Maternal Grandmother     Review of Systems  Constitutional:       Weight gain  HENT: Negative.   Eyes: Negative.   Respiratory: Negative.   Cardiovascular: Negative.   Gastrointestinal: Negative.   Endocrine: Negative.   Genitourinary: Negative.   Musculoskeletal: Negative.   Skin: Negative.   Allergic/Immunologic: Negative.   Neurological: Negative.   Hematological: Negative.   Psychiatric/Behavioral: Negative.     Exam:   BP 112/68 (BP Location: Left Arm, Patient Position: Sitting, Cuff Size: Normal)   Pulse 60   Temp 99.1 F (37.3 C) (Skin)   Ht 5' 4.5" (1.638 m)   Wt 182 lb 12.8 oz (82.9 kg)   BMI 30.89  kg/m   Weight change: @WEIGHTCHANGE @ Height:   Height: 5' 4.5" (163.8 cm)  Ht Readings from Last 3 Encounters:  03/25/19 5' 4.5" (1.638 m)  01/06/19 5\' 4"  (1.626 m)  10/14/18 5\' 4"  (1.626 m)    General appearance: alert, cooperative and appears stated age Head: Normocephalic, without obvious abnormality, atraumatic Neck: no adenopathy, supple, symmetrical, trachea midline and thyroid normal to inspection and palpation Lungs: clear to auscultation bilaterally Cardiovascular: regular rate and rhythm Breasts: normal appearance, no masses or tenderness Abdomen: soft, non-tender; non distended,  no masses,  no organomegaly Extremities: extremities normal, atraumatic, no cyanosis or edema Skin: Skin color, texture, turgor  normal. No rashes or lesions Lymph nodes: Cervical, supraclavicular, and axillary nodes normal. No abnormal inguinal nodes palpated Neurologic: Grossly normal   Pelvic: External genitalia:  no lesions              Urethra:  normal appearing urethra with no masses, tenderness or lesions              Bartholins and Skenes: normal                 Vagina: normal appearing vagina with normal color and discharge, no lesions              Cervix: no lesions               Bimanual Exam:  Uterus:  normal size, contour, position, consistency, mobility, non-tender              Adnexa: no mass, fullness, tenderness               Rectovaginal: Confirms               Anus:  normal sphincter tone, no lesions  Chaperone was present for exam.  A:  Well Woman with normal exam  Contraception surveillance, happy with depo-provera  H/O LSIL last year  Weight gain  P:   Pap  STD testing  Discussed breast self exam  Discussed calcium and vit D intake  Continue depo-provera x 1 year  She is working on eating healthy, recommended she start exercise  Will get copy of her lab work  Labs reviewed from 12/30/18 (Dr Quintella ReichertAiken) Normal CBC, normal CMP, TSH slightly elevated at 4.66, normal T4, T3, normal B12, folate. Vit D low at 14.8, Lithium normal

## 2019-03-25 NOTE — Progress Notes (Signed)
Patient is here for Depo Provera Injection Patient is within Depo Provera Calender Limits  Next Depo Due between: August 14th, 2020-August 28th, 2020.  Patient is aware when next depo is due  Pt tolerated Injection well in LUOQ.  Routed to provider for review, encounter closed.

## 2019-03-26 LAB — RPR: RPR Ser Ql: NONREACTIVE

## 2019-03-26 LAB — HIV ANTIBODY (ROUTINE TESTING W REFLEX): HIV Screen 4th Generation wRfx: NONREACTIVE

## 2019-03-30 ENCOUNTER — Telehealth: Payer: Self-pay

## 2019-03-30 LAB — CYTOLOGY - PAP
Chlamydia: NEGATIVE
Diagnosis: UNDETERMINED — AB
Neisseria Gonorrhea: NEGATIVE
Trichomonas: NEGATIVE

## 2019-03-30 NOTE — Telephone Encounter (Signed)
-----   Message from Romualdo Bolk, MD sent at 03/30/2019  1:05 PM EDT ----- The patient had a LSIL pap in 2019, ASCUS this year. Given her age she should have another pap in a year, she doesn't need a colposcopy at this time. Please inform and put in 08 recall. Negative cervical cultures.

## 2019-03-30 NOTE — Telephone Encounter (Signed)
Attempted to reach patient. Voicemail box is full. Unable to leave message.

## 2019-04-05 NOTE — Telephone Encounter (Signed)
Notes recorded by Sprague, Harley Hallmark, RN on 03/30/2019 at 2:22 PM EDT Spoke with patient. Results given. Patient verbalizes understanding. 08 recall entered.

## 2019-04-25 ENCOUNTER — Telehealth: Payer: Self-pay | Admitting: Obstetrics and Gynecology

## 2019-04-25 NOTE — Telephone Encounter (Signed)
Please let the patient know that I got a copy of her lab work. Make sure she is taking vit D, her vit D level was very low in 3/20. She should be following up with her primary.

## 2019-04-25 NOTE — Telephone Encounter (Signed)
Spoke with patient. Patient states that she was started on Vitamin D 50,000 IU weekly after her appointment in March 2020. Will follow up with PCP. Encounter closed.

## 2019-06-13 ENCOUNTER — Ambulatory Visit: Payer: BLUE CROSS/BLUE SHIELD

## 2019-06-22 ENCOUNTER — Ambulatory Visit (INDEPENDENT_AMBULATORY_CARE_PROVIDER_SITE_OTHER): Payer: BLUE CROSS/BLUE SHIELD | Admitting: Obstetrics and Gynecology

## 2019-06-22 ENCOUNTER — Other Ambulatory Visit: Payer: Self-pay

## 2019-06-22 VITALS — BP 118/80 | HR 80 | Temp 99.0°F | Wt 188.6 lb

## 2019-06-22 DIAGNOSIS — Z3042 Encounter for surveillance of injectable contraceptive: Secondary | ICD-10-CM

## 2019-06-22 MED ORDER — MEDROXYPROGESTERONE ACETATE 150 MG/ML IM SUSP
150.0000 mg | Freq: Once | INTRAMUSCULAR | Status: AC
  Administered 2019-06-22: 17:00:00 150 mg via INTRAMUSCULAR

## 2019-06-22 NOTE — Progress Notes (Signed)
Patient is here for Depo Provera Injection Patient is within Depo Provera Calender Limits  Next Depo Due between: November 09/07/2019-09/21/2019 Last AEX: 03/25/2019 AEX Scheduled: 04/04/2020  Patient is aware when next depo is due.  Pt tolerated Injection well in RUOQ.  Routed to provider for review, encounter closed.

## 2019-06-28 ENCOUNTER — Ambulatory Visit (INDEPENDENT_AMBULATORY_CARE_PROVIDER_SITE_OTHER): Payer: BLUE CROSS/BLUE SHIELD | Admitting: Physician Assistant

## 2019-06-28 ENCOUNTER — Encounter: Payer: Self-pay | Admitting: Physician Assistant

## 2019-06-28 VITALS — Temp 98.7°F | Ht 64.0 in | Wt 188.8 lb

## 2019-06-28 DIAGNOSIS — E039 Hypothyroidism, unspecified: Secondary | ICD-10-CM | POA: Diagnosis not present

## 2019-06-28 DIAGNOSIS — M791 Myalgia, unspecified site: Secondary | ICD-10-CM

## 2019-06-28 DIAGNOSIS — R1084 Generalized abdominal pain: Secondary | ICD-10-CM

## 2019-06-28 DIAGNOSIS — F419 Anxiety disorder, unspecified: Secondary | ICD-10-CM | POA: Insufficient documentation

## 2019-06-28 DIAGNOSIS — K219 Gastro-esophageal reflux disease without esophagitis: Secondary | ICD-10-CM

## 2019-06-28 DIAGNOSIS — F3181 Bipolar II disorder: Secondary | ICD-10-CM | POA: Diagnosis not present

## 2019-06-28 DIAGNOSIS — E041 Nontoxic single thyroid nodule: Secondary | ICD-10-CM | POA: Insufficient documentation

## 2019-06-28 NOTE — Progress Notes (Signed)
Virtual Visit via Video   I connected with Terri Richards on 06/28/19 at  1:20 PM EDT by a video enabled telemedicine application and verified that I am speaking with the correct person using two identifiers. Location patient: Home Location provider: [facility], Office Persons participating in the virtual visit: Purvis Kiltsliya Mataya, Ahnaf Caponi PA-C  I discussed the limitations of evaluation and management by telemedicine and the availability of in person appointments. The patient expressed understanding and agreed to proceed.   Subjective:   HPI:   Bipolar 2 disorder -- currently sees [facility]. She has been on lithium and provigil for about one year. States that he routinely checks labs. Denies any current concerns with depression or anxiety.   Muscle cramping -- for most of her life, she reports cramping in her lower legs.  She states that this sometimes will wake her up at night.  Her mom always told her to just make sure she is eating enough potassium.  She states that her potassium is always been normal when checked.  She is not in any sports or does any excessive activity.  She also has noticed that recently she is having the same symptoms in her forearms.  She has had these symptoms since before starting her lithium and Synthroid, does not think it is related to this.  Denies calf swelling, history of DVT, any shortness of breath or palpitations.  LPR --recent history of this.  She was having some issue with her vocal cords, which was a significant issue as she has a vocalist, and [facility].  She was also having coughing at night.  It was determined that she had acid reflux and she was started on medication.  She has been on Protonix 80 mg daily for almost 1 year.  She states that this is the only effective treatment for her at this time.  IBS --she suspects that she has IBS, however this is never been diagnosed.  After  every time she eats, she develops abdominal cramping, urgency and has loose stools.  She does not feel that is related to anything that she eats.  She has not been tested for gluten intolerance.  She tried a low FODMAP diet for 2 days and did not have relief of symptoms so she stopped it.  She denies any family history of Crohn's, ulcerative colitis, colon cancer.  She denies any rectal bleeding or unintentional weight loss.  She has not really tried anything else for her symptoms.  She does feel like she has been like this forever, however her symptoms are getting worse with time.  Hypothyroidism --diagnosed about 3 years ago, and has been stable on her dose of levothyroxine 75 mcg for at least 1 year.  Her psychiatrist currently manages this.  ROS: See pertinent positives and negatives per HPI.  Patient Active Problem List   Diagnosis Date Noted  . Hypothyroidism   . Thyroid nodule   . Anxiety   . Bipolar 2 disorder (HCC)   . Chronic tonsillitis 05/10/2018  . Laryngopharyngeal reflux (LPR) 05/10/2018  . Dysmenorrhea 06/02/2016  . Abdominal discomfort, generalized 05/05/2016  . Depression 04/21/2016    Social History   Tobacco Use  . Smoking status: Former Smoker    Types: Cigarettes  . Smokeless tobacco: Never Used  Substance Use Topics  . Alcohol use: Yes    Alcohol/week: 1.0 standard drinks    Types:  1 Glasses of wine per week    Current Outpatient Medications:  .  Cholecalciferol (VITAMIN D3) 1.25 MG (50000 UT) CAPS, TK 1 C PO Q WEEK, Disp: , Rfl:  .  fluticasone (FLONASE) 50 MCG/ACT nasal spray, Place into both nostrils daily., Disp: , Rfl:  .  levocetirizine (XYZAL) 5 MG tablet, Take 5 mg by mouth every evening., Disp: , Rfl:  .  levothyroxine (SYNTHROID) 75 MCG tablet, Take 75 mcg by mouth daily before breakfast., Disp: , Rfl:  .  lithium carbonate (LITHOBID) 300 MG CR tablet, Take 4 tablets by mouth at bedtime., Disp: , Rfl:  .  modafinil (PROVIGIL) 200 MG tablet, TK 1 T  PO QAM, Disp: , Rfl:  .  naproxen sodium (ANAPROX DS) 550 MG tablet, 1 tablet q 12 hours prn pain, Disp: 30 tablet, Rfl: 2 .  olopatadine (PATANOL) 0.1 % ophthalmic solution, 1 drop 2 (two) times daily., Disp: , Rfl:  .  Omega 3-6-9 Fatty Acids (OMEGA-3 FUSION PO), Take by mouth., Disp: , Rfl:  .  pantoprazole (PROTONIX) 40 MG tablet, TK 1 T PO BID, Disp: , Rfl:   Allergies  Allergen Reactions  . Menthol Other (See Comments)    Dryness of throat.  . Tape Other (See Comments)    Uncoded Allergy. Allergen: tapes    Objective:   VITALS: Per patient if applicable, see vitals. GENERAL: Alert, appears well and in no acute distress. HEENT: Atraumatic, conjunctiva clear, no obvious abnormalities on inspection of external nose and ears. NECK: Normal movements of the head and neck. CARDIOPULMONARY: No increased WOB. Speaking in clear sentences. I:E ratio WNL.  MS: Moves all visible extremities without noticeable abnormality. PSYCH: Pleasant and cooperative, well-groomed. Speech normal rate and rhythm. Affect is appropriate. Insight and judgement are appropriate. Attention is focused, linear, and appropriate.  NEURO: CN grossly intact. Oriented as arrived to appointment on time with no prompting. Moves both UE equally.  SKIN: No obvious lesions, wounds, erythema, or cyanosis noted on face or hands.  Assessment and Plan:   [name] was seen today for establish care.  Diagnoses and all orders for this visit:  Bipolar 2 disorder ([place]) Well-controlled per patient.  Management per psych.  Denies SI/HI.  Hypothyroidism, unspecified type Well-controlled per patient.  Management per psych if patient prefers however happy to take over management in future if needed.  Abdominal discomfort, generalized; Laryngopharyngeal reflux (LPR) Given ongoing abdominal pain, significant use of chronic Protonix, will refer to GI for further evaluation and treatment.  We did discuss possibly testing for gluten,  however will defer to GI if they think this is appropriate.  Patient verbalized understanding to plan.  Myalgias Given chronicity, and ongoing worsening of pain, will check labs, however even if these are normal will defer to sports medicine.  She is agreeable to referral.  . Reviewed expectations re: course of current medical issues. . Discussed self-management of symptoms. . Outlined signs and symptoms indicating need for more acute intervention. . Patient verbalized understanding and all questions were answered. Marland Kitchen Health Maintenance issues including appropriate healthy diet, exercise, and smoking avoidance were discussed with patient. . See orders for this visit as documented in the electronic medical record.  I discussed the assessment and treatment plan with the patient. The patient was provided an opportunity to ask questions and all were answered. The patient agreed with the plan and demonstrated an understanding of the instructions.   The patient was advised to call back or seek an in-person  evaluation if the symptoms worsen or if the condition fails to improve as anticipated.   CMA or LPN served as scribe during this visit. History, Physical, and Plan performed by medical provider. The above documentation has been reviewed and is accurate and complete.  I spent 45 minutes with this patient, greater than 50% was face-to-face time counseling regarding the above diagnoses.  [place], [place] 06/28/2019

## 2019-06-28 NOTE — Addendum Note (Signed)
Addended by: Erlene Quan on: 06/28/2019 02:53 PM   Modules accepted: Orders

## 2019-06-30 ENCOUNTER — Other Ambulatory Visit (INDEPENDENT_AMBULATORY_CARE_PROVIDER_SITE_OTHER): Payer: BLUE CROSS/BLUE SHIELD

## 2019-06-30 ENCOUNTER — Other Ambulatory Visit: Payer: Self-pay

## 2019-06-30 ENCOUNTER — Encounter: Payer: Self-pay | Admitting: Physician Assistant

## 2019-06-30 ENCOUNTER — Other Ambulatory Visit: Payer: Self-pay | Admitting: Physician Assistant

## 2019-06-30 ENCOUNTER — Encounter: Payer: Self-pay | Admitting: Gastroenterology

## 2019-06-30 DIAGNOSIS — Z79899 Other long term (current) drug therapy: Secondary | ICD-10-CM

## 2019-06-30 DIAGNOSIS — R1084 Generalized abdominal pain: Secondary | ICD-10-CM | POA: Diagnosis not present

## 2019-06-30 LAB — CBC WITH DIFFERENTIAL/PLATELET
Basophils Absolute: 0 10*3/uL (ref 0.0–0.1)
Basophils Relative: 0.5 % (ref 0.0–3.0)
Eosinophils Absolute: 0.4 10*3/uL (ref 0.0–0.7)
Eosinophils Relative: 3.9 % (ref 0.0–5.0)
HCT: 44.1 % (ref 36.0–46.0)
Hemoglobin: 14.9 g/dL (ref 12.0–15.0)
Lymphocytes Relative: 34.9 % (ref 12.0–46.0)
Lymphs Abs: 3.2 10*3/uL (ref 0.7–4.0)
MCHC: 33.8 g/dL (ref 30.0–36.0)
MCV: 86.6 fl (ref 78.0–100.0)
Monocytes Absolute: 0.5 10*3/uL (ref 0.1–1.0)
Monocytes Relative: 6 % (ref 3.0–12.0)
Neutro Abs: 5 10*3/uL (ref 1.4–7.7)
Neutrophils Relative %: 54.7 % (ref 43.0–77.0)
Platelets: 244 10*3/uL (ref 150.0–400.0)
RBC: 5.09 Mil/uL (ref 3.87–5.11)
RDW: 13.2 % (ref 11.5–15.5)
WBC: 9.2 10*3/uL (ref 4.0–10.5)

## 2019-06-30 LAB — COMPREHENSIVE METABOLIC PANEL
ALT: 12 U/L (ref 0–35)
AST: 13 U/L (ref 0–37)
Albumin: 4.6 g/dL (ref 3.5–5.2)
Alkaline Phosphatase: 84 U/L (ref 39–117)
BUN: 18 mg/dL (ref 6–23)
CO2: 26 mEq/L (ref 19–32)
Calcium: 10.1 mg/dL (ref 8.4–10.5)
Chloride: 108 mEq/L (ref 96–112)
Creatinine, Ser: 1.05 mg/dL (ref 0.40–1.20)
GFR: 64.77 mL/min (ref >60.00)
Glucose, Bld: 102 mg/dL — ABNORMAL HIGH (ref 70–99)
Potassium: 4.8 mEq/L (ref 3.5–5.1)
Sodium: 141 mEq/L (ref 135–145)
Total Bilirubin: 0.4 mg/dL (ref 0.2–1.2)
Total Protein: 7 g/dL (ref 6.0–8.3)

## 2019-06-30 LAB — T4, FREE: Free T4: 1.13 ng/dL (ref 0.60–1.60)

## 2019-06-30 LAB — TSH: TSH: 3.36 u[IU]/mL (ref 0.35–4.50)

## 2019-07-01 ENCOUNTER — Encounter: Payer: Self-pay | Admitting: Family Medicine

## 2019-07-01 ENCOUNTER — Ambulatory Visit (INDEPENDENT_AMBULATORY_CARE_PROVIDER_SITE_OTHER): Payer: BLUE CROSS/BLUE SHIELD | Admitting: Family Medicine

## 2019-07-01 ENCOUNTER — Ambulatory Visit: Payer: Self-pay

## 2019-07-01 DIAGNOSIS — M5020 Other cervical disc displacement, unspecified cervical region: Secondary | ICD-10-CM

## 2019-07-01 DIAGNOSIS — M79604 Pain in right leg: Secondary | ICD-10-CM

## 2019-07-01 DIAGNOSIS — M79601 Pain in right arm: Secondary | ICD-10-CM | POA: Diagnosis not present

## 2019-07-01 DIAGNOSIS — M79602 Pain in left arm: Secondary | ICD-10-CM

## 2019-07-01 DIAGNOSIS — M79605 Pain in left leg: Secondary | ICD-10-CM

## 2019-07-01 LAB — LITHIUM LEVEL: Lithium Lvl: 0.4 mmol/L — ABNORMAL LOW (ref 0.6–1.2)

## 2019-07-01 NOTE — Progress Notes (Signed)
C5-6 protrusion with stenosis?  CT 06-15-18    Office Visit Note   Patient: Terri Richards           Date of Birth: 1996-06-14           MRN: [id number] Visit Date: 07/01/2019 Requested by: Jarold MottoWorley, Samantha, [place] [address] [place],  [place] [id number] PCP: Jarold MottoWorley, Samantha, [place]  Subjective: Chief Complaint  Patient presents with  . Pain in legs since age 23, pain in forearms x 2 months    HPI: She is here with left greater than right arm pain and bilateral lower leg pain.  Around age 89 she started complaining of intermittent pain in her legs.  She apparently would approach her mother at night because she could not sleep due to pain, and she would be given Advil or something similar and the pain would go away.  It did not bother her enough to seek treatment but she has continued to have intermittent pain mainly toward the end of the day.  She cannot pinpoint where the pain comes from, it does not hurt to touch but she describes it as a deep aching pain from above the knees down the legs toward the feet.  She does not have any numbness, does not have any back or neck pain along with it.  She has not had any pain in a few days, she cannot re-create the pain.  Recently in the past few months she has had intermittent forearm pain, similar in nature to her leg pain with the left arm affected more than the right.  It seems to hurt from the elbow toward the wrist but again is not tender to palpation.  She has no noticeable swelling or redness in any of her joints.  There is no definite family history of any orthopedic or rheumatologic problems.  She does note that she has troubles with GERD.  She was evaluated last October with CT scan.  Incidentally the CT scan demonstrated a C5-6 disc protrusion which could create spinal stenosis.               ROS: Denies fevers or chills.  All other systems were reviewed and are negative.  Objective: Vital Signs: There were no vitals taken for this visit.   Physical Exam:  General:  Alert and oriented, in no acute distress. Pulm:  Breathing unlabored. Psy:  Normal mood, congruent affect. Skin: No visible rash. Neck: She has full range of motion with negative Spurling's test, no tenderness to palpation.  Upper extremity strength and reflexes are normal.  Her arms are nontender to palpation. Low back: Good range of motion, no scoliosis.  Straight leg raise negative, lower extremity strength and reflexes are normal. Legs: Nontender to palpation, I cannot reproduce her pain.   Imaging: X-rays tibia/fibula bilateral: Normal bony anatomy, no bone lesions or arthritic changes.    Assessment & Plan: 1.  Chronic lateral lower leg pain and more recent left greater than right arm pain, etiology uncertain.  Question whether cervical spine stenosis could be contributing, although I think it would be highly unlikely that this was present as a young child. -We will try physical therapy for strengthening. -She will notify me of her vitamin D level and we will optimize it. -She will try a food elimination diet to see if she can wean off proton pump inhibitors for long-term health. -If her pain gets worse or more frequent, we will order a cervical spine MRI scan and possibly  some other testing.     Procedures: No procedures performed  No notes on file     PMFS History: Patient Active Problem List   Diagnosis Date Noted  . Hypothyroidism   . Thyroid nodule   . Anxiety   . Bipolar 2 disorder ([place])   . Chronic tonsillitis 05/10/2018  . Laryngopharyngeal reflux (LPR) 05/10/2018  . Dysmenorrhea 06/02/2016  . Abdominal discomfort, generalized 05/05/2016  . Depression 04/21/2016   Past Medical History:  Diagnosis Date  . Anxiety   . Bipolar 2 disorder ([place])   . Depression   . Hypothyroidism   . Thyroid nodule     Family History  Problem Relation Age of Onset  . Anxiety disorder Mother   . Depression Father   . Thyroid disease Maternal  Grandmother     Past Surgical History:  Procedure Laterality Date  . ADENOIDECTOMY  ~2007  . HERNIA REPAIR     as child  . INTRAUTERINE DEVICE (IUD) INSERTION  03/16/2018   Kyleena   . ROOT CANAL     Social History   Occupational History  . Not on file  Tobacco Use  . Smoking status: Former Smoker    Types: Cigarettes  . Smokeless tobacco: Never Used  Substance and Sexual Activity  . Alcohol use: Yes    Alcohol/week: 1.0 standard drinks    Types: 1 Glasses of wine per week  . Drug use: Not Currently  . Sexual activity: Yes    Partners: Male, Female    Birth control/protection: Injection    Comment: depo injections

## 2019-08-04 ENCOUNTER — Encounter: Payer: Self-pay | Admitting: Gastroenterology

## 2019-08-04 ENCOUNTER — Ambulatory Visit: Payer: BLUE CROSS/BLUE SHIELD | Admitting: Gastroenterology

## 2019-08-04 ENCOUNTER — Other Ambulatory Visit (INDEPENDENT_AMBULATORY_CARE_PROVIDER_SITE_OTHER): Payer: BLUE CROSS/BLUE SHIELD

## 2019-08-04 ENCOUNTER — Other Ambulatory Visit: Payer: Self-pay

## 2019-08-04 VITALS — BP 128/84 | HR 99 | Temp 98.7°F | Ht 64.0 in | Wt 190.1 lb

## 2019-08-04 DIAGNOSIS — K529 Noninfective gastroenteritis and colitis, unspecified: Secondary | ICD-10-CM

## 2019-08-04 DIAGNOSIS — G8929 Other chronic pain: Secondary | ICD-10-CM

## 2019-08-04 DIAGNOSIS — R140 Abdominal distension (gaseous): Secondary | ICD-10-CM

## 2019-08-04 DIAGNOSIS — R1031 Right lower quadrant pain: Secondary | ICD-10-CM

## 2019-08-04 DIAGNOSIS — K219 Gastro-esophageal reflux disease without esophagitis: Secondary | ICD-10-CM | POA: Diagnosis not present

## 2019-08-04 LAB — IGA: IgA: 178 mg/dL (ref 68–378)

## 2019-08-04 MED ORDER — PANTOPRAZOLE SODIUM 40 MG PO TBEC: 40.0000 mg | DELAYED_RELEASE_TABLET | Freq: Two times a day (BID) | ORAL | 3 refills | Status: DC

## 2019-08-04 MED ORDER — HYOSCYAMINE SULFATE 0.125 MG SL SUBL: 0.1250 mg | SUBLINGUAL_TABLET | Freq: Two times a day (BID) | SUBLINGUAL | 1 refills | Status: AC | PRN

## 2019-08-04 NOTE — Patient Instructions (Addendum)
If you are age 23 or older, your body mass index should be between 23-30. Your Body mass index is 32.63 kg/m. If this is out of the aforementioned range listed, please consider follow up with your Primary Care Provider.  If you are age 67 or younger, your body mass index should be between 19-25. Your Body mass index is 32.63 kg/m. If this is out of the aformentioned range listed, please consider follow up with your Primary Care Provider.   Your provider has requested that you go to the basement level for lab work before leaving today. Press "B" on the elevator. The lab is located at the first door on the left as you exit the elevator.  We will get your records for review from [place].   Please follow up in 6 weeks. [phone]  Food Guidelines for gas and bloating  Many people have difficulty digesting certain foods, causing a variety of distressing and embarrassing symptoms such as abdominal pain, bloating and gas.  These foods may need to be avoided or consumed in small amounts.  Here are some tips that might be helpful for you.  1.   Lactose intolerance is the difficulty or complete inability to digest lactose, the natural sugar in milk and anything made from milk.  This condition is harmless, common, and can begin any time during life.  Some people can digest a modest amount of lactose while others cannot tolerate any.  Also, not all dairy products contain equal amounts of lactose.  For example, hard cheeses such as parmesan have less lactose than soft cheeses such as cheddar.  Yogurt has less lactose than milk or cheese.  Many packaged foods (even many brands of bread) have milk, so read ingredient lists carefully.  It is difficult to test for lactose intolerance, so just try avoiding lactose as much as possible for a week and see what happens with your symptoms.  If you seem to be lactose intolerant, the best plan is to avoid it (but make sure you get calcium from another  source).  The next best thing is to use lactase enzyme supplements, available over the counter everywhere.  Just know that many lactose intolerant people need to take several tablets with each serving of dairy to avoid symptoms.  Lastly, a lot of restaurant food is made with milk or butter.  Many are things you might not suspect, such as mashed potatoes, rice and pasta (cooked with butter) and "grilled" items.  If you are lactose intolerant, it never hurts to ask your server what has milk or butter.  2.   Fiber is an important part of your diet, but not all fiber is well-tolerated.  Insoluble fiber such as bran is often consumed by normal gut bacteria and converted into gas.  Soluble fiber such as oats, squash, carrots and green beans are typically tolerated better.  3.   Some types of carbohydrates can be poorly digested.  Examples include: fructose (apples, cherries, pears, raisins and other dried fruits), fructans (onions, zucchini, large amounts of wheat), sorbitol/mannitol/xylitol and sucralose/Splenda (common artificial sweeteners), and raffinose (lentils, broccoli, cabbage, asparagus, brussel sprouts, many types of beans).  Do a Development worker, community for [employer] and you will find helpful information. Beano, a dietary supplement, will often help with raffinose-containing foods.  As with lactase tablets, you may need several per serving.  4.   Whenever possible, avoid processed food&meats and chemical additives.  High fructose corn syrup, a common sweetener, may be  difficult to digest.  Eggs and soy (comes from the soybean, and added to many foods now) are other common bloating/gassy foods.  - Dr. Sherlynn [facility]

## 2019-08-04 NOTE — Progress Notes (Signed)
[facility] Consult Note:  History: Terri Richards 08/04/2019  Referring provider: Jarold Motto, PA  Reason for consult/chief complaint: Gastroesophageal Reflux and Irritable Bowel Syndrome   Subjective  HPI:  This is a very pleasant 23 year old woman referred by her new primary care provider for reflux and chronic diarrhea.  She reports seeing [facility] GI about 2 years ago for chronic heartburn and severe chronic cough that had been evaluated by an ENT provider as well.  It sounds like there was clinical suspicion for GERD as the cause of cough, she tells me an upper endoscopy was done indicating "increased acid production".  She was started on pantoprazole 40 mg daily, but had insufficient relief of symptoms, but felt considerably better on twice daily dosing.  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 Her [place] primary care provider left, so Terri Richards has been off her PPI for about 6 months with recurrence of same symptoms.  She has also put on about 50 pounds in the last year.  Her other chronic complaint is lower abdominal crampy pain with urgency and diarrhea ever since adolescence.  It used to be worse during her menstrual cycle, but no longer the case and she is on Depo-Provera.  It does not sound like she had any particular work-up or had this problem addressed by [facility], and she has been on no medicines for it.   ROS:  Review of Systems  Constitutional: Negative for appetite change and unexpected weight change.       Weight gain as noted above  HENT: Negative for mouth sores and voice change.   Eyes: Negative for pain and redness.  Respiratory: Negative for cough and shortness of breath.   Cardiovascular: Negative for chest pain and palpitations.  Genitourinary: Negative for dysuria and hematuria.  Musculoskeletal:  Negative for arthralgias and myalgias.  Skin: Negative for pallor and rash.  Neurological: Negative for weakness and headaches.  Hematological: Negative for adenopathy.  Psychiatric/Behavioral:       Bipolar disorder     Past Medical History: Past Medical History:  Diagnosis Date  . Anxiety   . Bipolar 2 disorder (HCC)   . Depression   . Hypothyroidism   . Thyroid nodule      Past Surgical History: Past Surgical History:  Procedure Laterality Date  . ADENOIDECTOMY  ~2007  . HERNIA REPAIR     as child  . INTRAUTERINE DEVICE (IUD) INSERTION  03/16/2018   Kyleena   . ROOT CANAL       Family History: Family History  Problem Relation Age of Onset  . Anxiety disorder Mother   . Depression Father   . Thyroid disease Maternal Grandmother     Social History: Social History   Socioeconomic History  . Marital status: Single    Spouse name: Not on file  . Number of children: Not on file  . Years of education: Not on file  . Highest education level: Not on file  Occupational History  . Not on file  Social Needs  . Financial resource strain: Not on file  . Food insecurity    Worry: Not on file    Inability: Not on file  . Transportation needs    Medical: Not on file    Non-medical: Not on file  Tobacco Use  . Smoking status: Former Smoker  Types: Cigarettes  . Smokeless tobacco: Never Used  Substance and Sexual Activity  . Alcohol use: Yes    Alcohol/week: 1.0 standard drinks    Types: 1 Glasses of wine per week  . Drug use: Not Currently  . Sexual activity: Yes    Partners: Male, Female    Birth control/protection: Injection    Comment: depo injections  Lifestyle  . Physical activity    Days per week: Not on file    Minutes per session: Not on file  . Stress: Not on file  Relationships  . Social Musicianconnections    Talks on phone: Not on file    Gets together: Not on file    Attends religious service: Not on file    Active member of club or  organization: Not on file    Attends meetings of clubs or organizations: Not on file    Relationship status: Not on file  Other Topics Concern  . 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  Looking for work in that field.  Originally [place].  Allergies: Allergies  Allergen Reactions  . Menthol Other (See Comments)    Dryness of throat.  . Tape Other (See Comments)    Uncoded Allergy. Allergen: tapes    Outpatient Meds: Current Outpatient Medications  Medication Sig Dispense Refill  . fluticasone (FLONASE) 50 MCG/ACT nasal spray Place into both nostrils daily.    Marland Kitchen. levocetirizine (XYZAL) 5 MG tablet Take 5 mg by mouth every evening.    Marland Kitchen. levothyroxine (SYNTHROID) 75 MCG tablet Take 75 mcg by mouth daily before breakfast.    . lithium carbonate (LITHOBID) 300 MG CR tablet Take 4 tablets by mouth at bedtime.    . naproxen sodium (ANAPROX DS) 550 MG tablet 1 tablet q 12 hours prn pain 30 tablet 2  . olopatadine (PATANOL) 0.1 % ophthalmic solution 1 drop 2 (two) times daily.    . hyoscyamine (LEVSIN SL) 0.125 MG SL tablet Place 1 tablet (0.125 mg total) under the tongue 2 (two) times daily as needed. 60 tablet 1  . modafinil (PROVIGIL) 200 MG tablet TK 1 T PO QAM    . pantoprazole (PROTONIX) 40 MG tablet Take 1 tablet (40 mg total) by mouth 2 (two) times daily before a meal. 60 tablet 3   No current facility-administered medications for this visit.       ___________________________________________________________________ Objective   Exam:  BP 128/84 (BP Location: Left Arm, Patient Position: Sitting, Cuff Size: Normal)   Pulse 99   Temp 98.7 F (37.1 C) (Oral)   Ht 5\' 4"  (1.626 m)   Wt 190 lb 2 oz (86.2 kg)   BMI 32.63 kg/m    General: Well-appearing, pleasant and conversational, normal vocal quality.  Eyes: sclera anicteric, no redness   ENT: oral mucosa moist without lesions, no cervical or supraclavicular lymphadenopathy  CV: RRR without murmur, S1/S2, no JVD, no peripheral edema  Resp: clear to auscultation bilaterally, normal RR and effort noted  GI: soft, no tenderness, with active bowel sounds. No guarding or palpable organomegaly noted.  Skin; warm and dry, no rash or jaundice noted  Neuro: awake, alert and oriented x 3. Normal gross motor function and fluent speech  Labs:  CBC Latest Ref Rng & Units 06/30/2019  WBC 4.0 - 10.5 K/uL 9.2  Hemoglobin 12.0 - 15.0 g/dL 14.9  Hematocrit 36.0 - 46.0 % 44.1  Platelets 150.0 - 400.0 K/uL 244.0   CMP Latest Ref Rng & Units 06/30/2019  Glucose 70 - 99 mg/dL 102(H)  BUN 6 - 23 mg/dL 18  Creatinine 0.40 - 1.20 mg/dL 1.05  Sodium 135 - 145 mEq/L 141  Potassium 3.5 - 5.1 mEq/L 4.8  Chloride 96 - 112 mEq/L 108  CO2 19 - 32 mEq/L 26  Calcium 8.4 - 10.5 mg/dL 10.1  Total Protein 6.0 - 8.3 g/dL 7.0  Total Bilirubin 0.2 - 1.2 mg/dL 0.4  Alkaline Phos 39 - 117 U/L 84  AST 0 - 37 U/L 13  ALT 0 - 35 U/L 12   Recent lithium level low at 0.4  Recent normal TSH and free T4  Assessment: Encounter Diagnoses  Name Primary?  . Gastroesophageal reflux disease, unspecified whether esophagitis present Yes  . Chronic diarrhea   . Abdominal bloating   . Abdominal pain, chronic, right lower quadrant     She seems to have established diagnosis of GERD with a predominant chronic cough that all improved on high-dose acid suppression.  Lower GI symptoms sound most like IBS, less likely celiac sprue or IBD.  Plan:  Written dietary advice given. Celiac antibody testing  Pantoprazole 40 mg twice daily prescribed.  Diet and lifestyle measures for reflux also reviewed.  Records requested from [facility], and have patient follow-up with me in about 6 weeks.  With that, we will then need to address the best long-term therapy for her reflux and whether additional testing such as pH  and/or manometry may be needed to confirm/quantify diagnosis to see if something like a TIF would be appropriate. As best it can be achieved, weight loss would most likely help her reflux symptoms as well, though I understand they were present before she had this more recent significant weight gain.  Trial of Levsin 0.125 mg, start once every morning, increase to 2 or 3 times a day as needed if helpful for lower GI symptoms.  Thank you for the courtesy of this consult.  Please call me with any questions or concerns.  Nelida Meuse III  CC: Referring provider noted above

## 2019-08-05 LAB — TISSUE TRANSGLUTAMINASE, IGA: (tTG) Ab, IgA: 1 U/mL

## 2019-09-06 NOTE — Progress Notes (Signed)
Patient is here for Depo Provera Injection Patient is within Depo Provera Calender Limits yes, last given 06-22-2019 Next Depo Due between: 11-23-19 to 12-07-19 Last AEX: 03-25-2019 JJ  AEX Scheduled: 04-04-20  Patient is aware when next depo is due  Pt tolerated Injection well in [place].  Routed to provider for review, encounter closed.

## 2019-09-07 ENCOUNTER — Other Ambulatory Visit: Payer: Self-pay

## 2019-09-07 ENCOUNTER — Ambulatory Visit (INDEPENDENT_AMBULATORY_CARE_PROVIDER_SITE_OTHER): Payer: BLUE CROSS/BLUE SHIELD | Admitting: *Deleted

## 2019-09-07 VITALS — BP 104/72 | HR 78 | Temp 97.4°F | Resp 14 | Ht 64.0 in | Wt 190.5 lb

## 2019-09-07 DIAGNOSIS — Z3042 Encounter for surveillance of injectable contraceptive: Secondary | ICD-10-CM

## 2019-09-07 MED ORDER — MEDROXYPROGESTERONE ACETATE 150 MG/ML IM SUSP
150.0000 mg | Freq: Once | INTRAMUSCULAR | Status: AC
  Administered 2019-09-07: 14:00:00 150 mg via INTRAMUSCULAR

## 2019-09-21 ENCOUNTER — Encounter: Payer: Self-pay | Admitting: Gastroenterology

## 2019-09-21 ENCOUNTER — Ambulatory Visit: Payer: BLUE CROSS/BLUE SHIELD | Admitting: Gastroenterology

## 2019-09-21 VITALS — BP 112/70 | HR 72 | Temp 97.9°F | Ht 64.0 in | Wt 184.0 lb

## 2019-09-21 DIAGNOSIS — R140 Abdominal distension (gaseous): Secondary | ICD-10-CM

## 2019-09-21 DIAGNOSIS — K219 Gastro-esophageal reflux disease without esophagitis: Secondary | ICD-10-CM

## 2019-09-21 DIAGNOSIS — K580 Irritable bowel syndrome with diarrhea: Secondary | ICD-10-CM | POA: Diagnosis not present

## 2019-09-21 NOTE — Progress Notes (Signed)
[facility] Progress Note  Chief Complaint: GERD  Subjective  History:  Quanetta is improved since her visit in early October.  Resuming Protonix is again controlled her heartburn and regurgitation symptoms.  It seems that she did not know I prescribed the Levsin, or did not pick it up at the pharmacy, but coincidentally had taken a few doses that a friend of hers gave her.  She found it enormously helpful to reduce bloating cramps and urgency.  During the visit she then checked with her pharmacy, and saw that the medicine was on file and she needed to pick it up.  She is also happy about recently finding work, and this has decreased her stress. Review of systems Cardiovascular:  no chest pain Respiratory: no dyspnea  The patient's Past Medical, Family and Social History were reviewed and are on file in the EMR.  Objective:  Med list reviewed  Current Outpatient Medications:  .  fexofenadine (ALLEGRA) 180 MG tablet, TK 1 T PO QD PRN, Disp: , Rfl:  .  fluticasone (FLONASE) 50 MCG/ACT nasal spray, Place into both nostrils daily., Disp: , Rfl:  .  hyoscyamine (LEVSIN SL) 0.125 MG SL tablet, Place 1 tablet (0.125 mg total) under the tongue 2 (two) times daily as needed., Disp: 60 tablet, Rfl: 1 .  levocetirizine (XYZAL) 5 MG tablet, Take 5 mg by mouth every evening., Disp: , Rfl:  .  levothyroxine (SYNTHROID) 75 MCG tablet, Take 75 mcg by mouth daily before breakfast., Disp: , Rfl:  .  lithium carbonate (LITHOBID) 300 MG CR tablet, Take 4 tablets by mouth at bedtime., Disp: , Rfl:  .  modafinil (PROVIGIL) 200 MG tablet, TK 1 T PO QAM, Disp: , Rfl:  .  naproxen sodium (ANAPROX DS) 550 MG tablet, 1 tablet q 12 hours prn pain, Disp: 30 tablet, Rfl: 2 .  olopatadine (PATANOL) 0.1 % ophthalmic solution, 1 drop 2 (two) times daily., Disp: , Rfl:  .  pantoprazole (PROTONIX) 40 MG tablet, Take 1 tablet (40 mg total) by mouth 2 (two) times daily before a meal., Disp: 60 tablet, Rfl: 3    Vital signs in last 24 hrs: Vitals:   09/21/19 0929  BP: 112/70  Pulse: 72  Temp: 97.9 F (36.6 C)    Physical Exam   she is well-appearing  Cardiac: RRR without murmurs, S1S2 heard, no peripheral edema  Pulm: clear to auscultation bilaterally, normal RR and effort noted  Abdomen: soft, no tenderness, with active bowel sounds. No guarding or palpable hepatosplenomegaly.  Skin; warm and dry, no jaundice or rash  @ASSESSMENTPLANBEGIN @ Assessment: Encounter Diagnoses  Name Primary?  . Gastroesophageal reflux disease, unspecified whether esophagitis present Yes  . Irritable bowel syndrome with diarrhea   . Abdominal bloating    Chronic GERD, previously evaluated at [facility] clinic, and we were not able to get any records from their clinic. Symptoms now under better control on acid suppression.  We discussed the need for diet and lifestyle measures as well for long-term control and also hopefully to allow weaning off acid suppression altogether at some point.  She has IBS symptoms that are better on as needed use of Levsin.  We made sure that prescription was active with her, she will get it filled today and use it as needed.  Dietary measures were also reviewed, reading material about that was given with her paperwork today.   She will return to see me as needed.  Total time 20 minutes, over  half spent face-to-face with patient in counseling and coordination of care.   Charlie Pitter III

## 2019-09-21 NOTE — Patient Instructions (Addendum)
If you are age 23 or older, your body mass index should be between 23-30. Your Body mass index is 31.58 kg/m. If this is out of the aforementioned range listed, please consider follow up with your Primary Care Provider.  If you are age 64 or younger, your body mass index should be between 19-25. Your Body mass index is 31.58 kg/m. If this is out of the aformentioned range listed, please consider follow up with your Primary Care Provider.   It was a pleasure to see you today!  Dr. Loletha Carrow _______________________________________________________  Food Guidelines for gas and bloating  Many people have difficulty digesting certain foods, causing a variety of distressing and embarrassing symptoms such as abdominal pain, bloating and gas.  These foods may need to be avoided or consumed in small amounts.  Here are some tips that might be helpful for you.  1.   Lactose intolerance is the difficulty or complete inability to digest lactose, the natural sugar in milk and anything made from milk.  This condition is harmless, common, and can begin any time during life.  Some people can digest a modest amount of lactose while others cannot tolerate any.  Also, not all dairy products contain equal amounts of lactose.  For example, hard cheeses such as parmesan have less lactose than soft cheeses such as cheddar.  Yogurt has less lactose than milk or cheese.  Many packaged foods (even many brands of bread) have milk, so read ingredient lists carefully.  It is difficult to test for lactose intolerance, so just try avoiding lactose as much as possible for a week and see what happens with your symptoms.  If you seem to be lactose intolerant, the best plan is to avoid it (but make sure you get calcium from another source).  The next best thing is to use lactase enzyme supplements, available over the counter everywhere.  Just know that many lactose intolerant people need to take several tablets with each serving of dairy to  avoid symptoms.  Lastly, a lot of restaurant food is made with milk or butter.  Many are things you might not suspect, such as mashed potatoes, rice and pasta (cooked with butter) and "grilled" items.  If you are lactose intolerant, it never hurts to ask your server what has milk or butter.  2.   Fiber is an important part of your diet, but not all fiber is well-tolerated.  Insoluble fiber such as bran is often consumed by normal gut bacteria and converted into gas.  Soluble fiber such as oats, squash, carrots and green beans are typically tolerated better.  3.   Some types of carbohydrates can be poorly digested.  Examples include: fructose (apples, cherries, pears, raisins and other dried fruits), fructans (onions, zucchini, large amounts of wheat), sorbitol/mannitol/xylitol and sucralose/Splenda (common artificial sweeteners), and raffinose (lentils, broccoli, cabbage, asparagus, brussel sprouts, many types of beans).  Do a Development worker, community for [employer] and you will find helpful information. Beano, a dietary supplement, will often help with raffinose-containing foods.  As with lactase tablets, you may need several per serving.  4.   Whenever possible, avoid processed food&meats and chemical additives.  High fructose corn syrup, a common sweetener, may be difficult to digest.  Eggs and soy (comes from the soybean, and added to many foods now) are other common bloating/gassy foods.  - Dr. [facility]

## 2019-11-23 ENCOUNTER — Ambulatory Visit: Payer: BLUE CROSS/BLUE SHIELD

## 2019-11-23 NOTE — Progress Notes (Deleted)
Patient is here for Depo Provera Injection Patient is within Depo Provera Calender Limits 11/23/19-12/07/19 Next Depo Due between: 4/14-4/28/21 Last AEX: 03/25/19 JJ AEX Scheduled: 04/04/20  Patient is aware when next depo is due  Pt tolerated Injection well in [place]OQ  Routed to provider for review, encounter closed.

## 2019-11-28 ENCOUNTER — Other Ambulatory Visit: Payer: Self-pay

## 2019-11-29 ENCOUNTER — Ambulatory Visit (INDEPENDENT_AMBULATORY_CARE_PROVIDER_SITE_OTHER): Payer: BC Managed Care – PPO | Admitting: Obstetrics and Gynecology

## 2019-11-29 VITALS — BP 120/75 | HR 87 | Temp 98.7°F | Ht 64.0 in | Wt 192.0 lb

## 2019-11-29 DIAGNOSIS — Z3042 Encounter for surveillance of injectable contraceptive: Secondary | ICD-10-CM | POA: Diagnosis not present

## 2019-11-29 MED ORDER — MEDROXYPROGESTERONE ACETATE 150 MG/ML IM SUSP: 150.0000 mg | Freq: Once | INTRAMUSCULAR | Status: AC

## 2019-11-29 NOTE — Progress Notes (Signed)
Patient is here for Depo Provera Injection Patient is within Depo Provera Calender Limits 11/23/2019-12/07/2019 Next Depo Due between: 4/20-02/28/2020 Last AEX: 03/25/2019 AEX Scheduled: 04/04/2020  Patient is aware when next depo is due  Pt tolerated Injection well in [place].  Routed to provider for review, encounter closed.

## 2020-01-09 ENCOUNTER — Encounter: Payer: Self-pay | Admitting: Certified Nurse Midwife

## 2020-01-11 ENCOUNTER — Encounter: Payer: Self-pay | Admitting: Certified Nurse Midwife

## 2020-02-14 ENCOUNTER — Other Ambulatory Visit: Payer: Self-pay

## 2020-02-14 ENCOUNTER — Ambulatory Visit: Payer: BC Managed Care – PPO

## 2020-02-14 NOTE — Progress Notes (Deleted)
Patient is here for Depo Provera Injection Patient is within Depo Provera Calender Limits yes Next Depo Due between: 7/6-7/20 Last AEX: 03/25/19 JJ AEX Scheduled: 04/04/20  Patient is aware when next depo is due  Pt tolerated Injection well in RUOQ.  Routed to provider for review, encounter closed.

## 2020-02-15 ENCOUNTER — Ambulatory Visit (INDEPENDENT_AMBULATORY_CARE_PROVIDER_SITE_OTHER): Payer: BC Managed Care – PPO

## 2020-02-15 VITALS — BP 118/68 | HR 88 | Temp 98.1°F | Ht 64.0 in | Wt 190.6 lb

## 2020-02-15 DIAGNOSIS — Z3042 Encounter for surveillance of injectable contraceptive: Secondary | ICD-10-CM | POA: Diagnosis not present

## 2020-02-15 MED ORDER — MEDROXYPROGESTERONE ACETATE 150 MG/ML IM SUSP
150.0000 mg | Freq: Once | INTRAMUSCULAR | Status: AC
  Administered 2020-02-15: 150 mg via INTRAMUSCULAR

## 2020-02-15 NOTE — Progress Notes (Signed)
Patient is here for Depo Provera Injection Patient is within Depo Provera Calender Limits Yes, 4/20-02/28/2020 Next Depo Due between: 7/7- 05/16/2020   Last AEX: 03/25/2019 AEX Scheduled: 04/04/2020  Patient is aware when next depo is due  Pt tolerated Injection well in RUOQ  Routed to provider for review, encounter closed.

## 2020-02-27 ENCOUNTER — Other Ambulatory Visit: Payer: Self-pay

## 2020-02-27 MED ORDER — PANTOPRAZOLE SODIUM 40 MG PO TBEC: 40.0000 mg | DELAYED_RELEASE_TABLET | Freq: Two times a day (BID) | ORAL | 3 refills | Status: AC

## 2020-04-04 ENCOUNTER — Ambulatory Visit: Payer: BLUE CROSS/BLUE SHIELD | Admitting: Obstetrics and Gynecology

## 2020-06-06 ENCOUNTER — Telehealth: Payer: Self-pay | Admitting: *Deleted

## 2020-06-06 NOTE — Telephone Encounter (Signed)
Call to patient. Patient states she has transferred care to [facility] in [place]. RN advised would update Dr. Oscar La. Patient agreeable.   Okay to remove from 08 recall?

## 2020-06-07 NOTE — Telephone Encounter (Signed)
Yes, please remove from recall

## 2020-06-13 NOTE — Telephone Encounter (Signed)
Patient removed from 08 recall per Dr. Oscar La.   Encounter closed.

## 2022-07-21 ENCOUNTER — Encounter: Payer: Self-pay | Admitting: *Deleted

## 2022-10-09 ENCOUNTER — Encounter: Payer: Self-pay | Admitting: *Deleted
